# Patient Record
Sex: Male | Born: 1960
Health system: Southern US, Community
[De-identification: ages and names within clinical notes are randomized; demographics above are authoritative.]

## PROBLEM LIST (undated history)

## (undated) DIAGNOSIS — Z6372 Alcoholism and drug addiction in family: Secondary | ICD-10-CM

## (undated) DIAGNOSIS — J309 Allergic rhinitis, unspecified: Secondary | ICD-10-CM

## (undated) DIAGNOSIS — N400 Enlarged prostate without lower urinary tract symptoms: Secondary | ICD-10-CM

## (undated) HISTORY — DX: Alcoholism and drug addiction in family: Z63.72

## (undated) HISTORY — DX: Benign prostatic hyperplasia without lower urinary tract symptoms: N40.0

## (undated) HISTORY — DX: Allergic rhinitis, unspecified: J30.9

---
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
## 2001-05-03 HISTORY — PX: APPENDECTOMY: SHX54

## 2004-05-03 HISTORY — PX: INGUINAL HERNIA REPAIR: SUR1180

## 2004-09-11 ENCOUNTER — Encounter: Payer: Self-pay | Admitting: Family Medicine

## 2007-01-25 ENCOUNTER — Ambulatory Visit: Payer: Self-pay | Admitting: Family Medicine

## 2007-01-25 DIAGNOSIS — N400 Enlarged prostate without lower urinary tract symptoms: Secondary | ICD-10-CM | POA: Insufficient documentation

## 2007-01-27 DIAGNOSIS — J309 Allergic rhinitis, unspecified: Secondary | ICD-10-CM

## 2007-01-30 ENCOUNTER — Encounter: Payer: Self-pay | Admitting: Family Medicine

## 2007-02-10 ENCOUNTER — Encounter (INDEPENDENT_AMBULATORY_CARE_PROVIDER_SITE_OTHER): Payer: Self-pay | Admitting: *Deleted

## 2007-08-28 ENCOUNTER — Telehealth (INDEPENDENT_AMBULATORY_CARE_PROVIDER_SITE_OTHER): Payer: Self-pay | Admitting: Internal Medicine

## 2008-04-17 ENCOUNTER — Telehealth: Payer: Self-pay | Admitting: Family Medicine

## 2008-04-24 ENCOUNTER — Ambulatory Visit: Payer: Self-pay | Admitting: Family Medicine

## 2008-04-24 DIAGNOSIS — T148XXA Other injury of unspecified body region, initial encounter: Secondary | ICD-10-CM

## 2008-06-20 ENCOUNTER — Ambulatory Visit: Payer: Self-pay | Admitting: Family Medicine

## 2009-04-28 ENCOUNTER — Telehealth: Payer: Self-pay | Admitting: Family Medicine

## 2009-05-16 ENCOUNTER — Ambulatory Visit: Payer: Self-pay | Admitting: Family Medicine

## 2009-05-30 ENCOUNTER — Ambulatory Visit: Payer: Self-pay | Admitting: Family Medicine

## 2009-06-02 LAB — CONVERTED CEMR LAB
ALT: 33 units/L (ref 0–53)
Albumin: 4.5 g/dL (ref 3.5–5.2)
BUN: 17 mg/dL (ref 6–23)
Bilirubin, Direct: 0.1 mg/dL (ref 0.0–0.3)
CO2: 31 meq/L (ref 19–32)
Calcium: 9.2 mg/dL (ref 8.4–10.5)
Creatinine, Ser: 1 mg/dL (ref 0.4–1.5)
Glucose, Bld: 100 mg/dL — ABNORMAL HIGH (ref 70–99)
PSA: 2.63 ng/mL (ref 0.10–4.00)
Total Protein: 7.6 g/dL (ref 6.0–8.3)

## 2009-06-04 ENCOUNTER — Ambulatory Visit: Payer: Self-pay | Admitting: Family Medicine

## 2009-06-05 ENCOUNTER — Encounter (INDEPENDENT_AMBULATORY_CARE_PROVIDER_SITE_OTHER): Payer: Self-pay | Admitting: *Deleted

## 2009-06-26 ENCOUNTER — Encounter (INDEPENDENT_AMBULATORY_CARE_PROVIDER_SITE_OTHER): Payer: Self-pay | Admitting: *Deleted

## 2009-06-27 ENCOUNTER — Ambulatory Visit: Payer: Self-pay | Admitting: Gastroenterology

## 2009-07-11 ENCOUNTER — Ambulatory Visit: Payer: Self-pay | Admitting: Gastroenterology

## 2009-07-15 LAB — CONVERTED CEMR LAB
Cholesterol: 190 mg/dL (ref 0–200)
Triglycerides: 51 mg/dL (ref 0.0–149.0)

## 2009-07-16 ENCOUNTER — Encounter: Payer: Self-pay | Admitting: Gastroenterology

## 2010-06-03 NOTE — Procedures (Signed)
Summary: Colonoscopy  Patient: Chase Price Note: All result statuses are Final unless otherwise noted.  Tests: (1) Colonoscopy (COL)   COL Colonoscopy           DONE     [facility]     [address]     [place], [place]  [address]           COLONOSCOPY PROCEDURE REPORT           PATIENT:  Chase Price, Chase Price  MR#:  [id number]     BIRTHDATE:  1960/06/25, 49 yrs. old  GENDER:  male           ENDOSCOPIST:  Barbette Hair. Arlyce Dice, MD     Referred by:  Excell Seltzer, M.D.           PROCEDURE DATE:  07/11/2009     PROCEDURE:  Colonoscopy with snare polypectomy     ASA CLASS:  Class I     INDICATIONS:  Routine Risk Screening           MEDICATIONS:   Fentanyl 75 mcg IV, Versed 8 mg IV           DESCRIPTION OF PROCEDURE:   After the risks benefits and     alternatives of the procedure were thoroughly explained, informed     consent was obtained.  Digital rectal exam was performed and     revealed no abnormalities.   The LB [id number] endoscope     was introduced through the anus and advanced to the cecum, which     was identified by the ileocecal valve, without limitations.  The     quality of the prep was excellent, using MoviPrep.  The instrument     was then slowly withdrawn as the colon was fully examined.     <<PROCEDUREIMAGES>>           FINDINGS:  Four polyps were found in the sigmoid colon (see     image15, image17, and image18). 4 sessile polyps ranging from     3-79mm at 28cm, 15cm, and 5cm each from the anus - removed with     cold polypectomy snare and submitted to pathology  This was     otherwise a normal examination of the colon (see image1, image3,     image4, image5, image8, image10, image11, image13, and image16).     Retroflexed views in the rectum revealed no abnormalities.    The     scope was then withdrawn from the patient and the procedure     completed.           COMPLICATIONS:  None           ENDOSCOPIC IMPRESSION:     1) Four polyps in the  sigmoid colon     2) Otherwise normal examination     RECOMMENDATIONS:     1) colonoscopy           REPEAT EXAM:  In 3 year(s) for Colonoscopy.           ______________________________     Barbette Hair. Arlyce Dice, MD           CC:           n.     eSIGNED:   Barbette Hair. Kaplan at 07/11/2009 08:54 AM           Rozetta Nunnery, [id number]  Note: An exclamation mark (!) indicates a result that was not dispersed into the flowsheet.  Document Creation Date: 07/11/2009 8:54 AM _______________________________________________________________________  (1) Order result status: Final Collection or observation date-time: 07/11/2009 08:44 Requested date-time:  Receipt date-time:  Reported date-time:  Referring Physician:   Ordering Physician: Melvia Heaps [phone]) Specimen Source:  Source: Launa Grill Order Number: [id number] Lab site:   Appended Document: Colonoscopy     Procedures Next Due Date:    Colonoscopy: 07/2014

## 2010-06-03 NOTE — Letter (Signed)
Summary: Previsit letter  [facility]  [address] [place], [place] [address]   Phone: [phone]  Fax: [phone]       06/05/2009 MRN: [id number]  [facility] [address] [place], [place]  [address]  Dear Chase Price,  Welcome to the Gastroenterology Division at [facility].    You are scheduled to see a nurse for your pre-procedure visit on 06-27-09 at 8:00a.m. on the 3rd floor at [facility], [address]  We ask that you try to arrive at our office 15 minutes prior to your appointment time to allow for check-in.  Your nurse visit will consist of discussing your medical and surgical history, your immediate family medical history, and your medications.    Please bring a complete list of all your medications or, if you prefer, bring the medication bottles and we will list them.  We will need to be aware of both prescribed and over the counter drugs.  We will need to know exact dosage information as well.  If you are on blood thinners (Coumadin, Plavix, Aggrenox, Ticlid, etc.) please call our office today/prior to your appointment, as we need to consult with your physician about holding your medication.   Please be prepared to read and sign documents such as consent forms, a financial agreement, and acknowledgement forms.  If necessary, and with your consent, a friend or relative is welcome to sit-in on the nurse visit with you.  Please bring your insurance card so that we may make a copy of it.  If your insurance requires a referral to see a specialist, please bring your referral form from your primary care physician.  No co-pay is required for this nurse visit.     If you cannot keep your appointment, please call [phone] to cancel or reschedule prior to your appointment date.  This allows [place] the opportunity to schedule an appointment for another patient in need of care.    Thank you for choosing [facility] for your medical  needs.  We appreciate the opportunity to care for you.  Please visit [place] at our website  to learn more about our practice.                     Sincerely.                                                                                                                   The Gastroenterology Division

## 2010-06-03 NOTE — Assessment & Plan Note (Signed)
Summary: CPX.Marland Kitchen CYD   Vital Signs:  Patient profile:   50 year old male Height:      70 inches Weight:      188.25 pounds BMI:     27.11 Temp:     97.9 degrees F oral Pulse rate:   84 / minute Pulse rhythm:   regular BP sitting:   110 / 70  (left arm) Cuff size:   large  Vitals Entered By: Delilah Shan CMA Duncan Dull) (June 04, 2009 9:13 AM) CC: CPX   History of Present Illness: The patient is here for annual wellness exam and preventative care.     Doing well overall, no complaints.  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 Take 1 Tablet By Mouth Once A Day 2)  Zyrtec Allergy 10 Mg  Tabs (Cetirizine Hcl) .... Take 1 Tablet By Mouth Once A Day  Allergies (verified): No Known Drug Allergies  Review of Systems General:  Denies fatigue and fever. ENT:  allergies well controlled..only issues in spring. Plans to use singulair only in spring.. CV:  Denies chest pain or discomfort. Resp:  Denies shortness of breath. GI:  Denies abdominal pain, constipation, and diarrhea. GU:  Denies dysuria, hematuria, incontinence, urinary frequency, and urinary hesitancy. Derm:  Denies lesion(s). Psych:  Denies anxiety and depression. Endo:  Denies weight change.  Physical Exam  General:  Well-developed,well-nourished,in no acute distress; alert,appropriate and cooperative throughout examination Ears:  External ear exam shows no significant lesions or deformities.  Otoscopic examination reveals clear canals, tympanic membranes are intact bilaterally without bulging, retraction, inflammation or discharge. Hearing is grossly  normal bilaterally. Nose:  External nasal examination shows no deformity or inflammation. Nasal mucosa are pink and moist without lesions or exudates. Mouth:  Oral mucosa and oropharynx without lesions or exudates.  Teeth in good repair. Neck:  no carotid bruit or thyromegaly no cervical or supraclavicular lymphadenopathy  Lungs:  Normal respiratory effort, chest expands symmetrically. Lungs are clear to auscultation, no crackles or wheezes. Heart:  Normal rate and regular rhythm. S1 and S2 normal without gallop, murmur, click, rub or other extra sounds. Abdomen:  Bowel sounds positive,abdomen soft and non-tender without masses, organomegaly or hernias noted. Rectal:  No external abnormalities noted. Normal sphincter tone. No rectal masses or tenderness. Genitalia:  Testes bilaterally descended without nodularity, tenderness or masses. No scrotal masses or lesions. No penis lesions or urethral discharge. Does have two hernia repair scars, intact. Prostate:  Prostate gland firm and smooth, no enlargement, nodularity, tenderness, mass, asymmetry or induration. Msk:  No deformity or scoliosis noted of thoracic or lumbar spine.   Pulses:  R and L posterior tibial pulses are full and equal bilaterally  Extremities:  no edema  Neurologic:  No cranial nerve deficits noted. Station and gait are normal. Plantar reflexes are down-going bilaterally. DTRs are symmetrical throughout. Sensory, motor and coordinative functions appear intact. Skin:  Intact without suspicious lesions or rashes Psych:  Cognition and judgment appear intact. Alert and cooperative with normal attention span and concentration. No apparent delusions, illusions, hallucinations   Impression & Recommendations:  Problem # 1:  Preventive Health Care (ICD-V70.0) Reviewed preventive  care protocols, scheduled due services, and updated immunizations. Encouraged exercise, weight loss, healthy eating habits.  Reviewed labs in detail with  pt..cholesterol not ordered correctly. Will add on.   Complete Medication List: 1)  Singulair 10 Mg Tabs (Montelukast sodium) .... Take 1 tablet by mouth once a day 2)  Zyrtec Allergy 10 Mg Tabs (Cetirizine hcl) .... Take 1 tablet by mouth once a day  Patient Instructions: 1)  Stool Cards please return.  2)  It is important that you exercise reguarly at least 20 minutes 5 times a week. If you develop chest pain, have severe difficulty breathing, or feel very tired, stop exercising immediately and seek medical attention.  3)  Please schedule a follow-up appointment in 1 year.   Current Allergies (reviewed today): No known allergies   Last TD:  Td (01/01/1998 2:31:00 PM) TD Next Due:  Refused Colonoscopy Next Due:  Not Indicated  Appended Document: Orders Update    Clinical Lists Changes  Problems: Added new problem of SCREENING, COLON CANCER (ICD-V76.51) Orders: Added new Referral order of Gastroenterology Referral (GI) - Signed

## 2010-06-03 NOTE — Letter (Signed)
Summary: [facility] Instructions  [facility]  [address] [place], [place] [address]   Phone: [phone]  Fax: [phone]       Chase Price    02-Jul-1960    MRN: [id number]        Procedure Day [name]:  [name]  07/11/09     Arrival Time:  7:30AM     Procedure Time:  8:00AM     Location of Procedure:                    [facility] (4th Floor)                      PREPARATION FOR COLONOSCOPY WITH MOVIPREP   Starting 5 days prior to your procedure 07/06/09 do not eat nuts, seeds, popcorn, corn, beans, peas,  salads, or any raw vegetables.  Do not take any fiber supplements (e.g. Metamucil, Citrucel, and Benefiber).  THE DAY BEFORE YOUR PROCEDURE         DATE: 07/10/09  DAY: THURSDAY  1.  Drink clear liquids the entire day-NO SOLID FOOD  2.  Do not drink anything colored red or purple.  Avoid juices with pulp.  No orange juice.  3.  Drink at least 64 oz. (8 glasses) of fluid/clear liquids during the day to prevent dehydration and help the prep work efficiently.  CLEAR LIQUIDS INCLUDE: Water Jello Ice Popsicles Tea (sugar ok, no milk/cream) Powdered fruit flavored drinks Coffee (sugar ok, no milk/cream) Gatorade Juice: apple, white grape, white cranberry  Lemonade Clear bullion, consomm, broth Carbonated beverages (any kind) Strained chicken noodle soup Hard Candy                             4.  In the morning, mix first dose of MoviPrep solution:    Empty 1 Pouch A and 1 Pouch B into the disposable container    Add lukewarm drinking water to the top line of the container. Mix to dissolve    Refrigerate (mixed solution should be used within 24 hrs)  5.  Begin drinking the prep at 5:00 p.m. The MoviPrep container is divided by 4 marks.   Every 15 minutes drink the solution down to the next mark (approximately 8 oz) until the full liter is complete.   6.  Follow completed prep with 16 oz of clear liquid of your choice (Nothing red  or purple).  Continue to drink clear liquids until bedtime.  7.  Before going to bed, mix second dose of MoviPrep solution:    Empty 1 Pouch A and 1 Pouch B into the disposable container    Add lukewarm drinking water to the top line of the container. Mix to dissolve    Refrigerate  THE DAY OF YOUR PROCEDURE      DATE: 07/11/09  DAY: FRIDAY  Beginning at 3:00AM (5 hours before procedure):         1. Every 15 minutes, drink the solution down to the next mark (approx 8 oz) until the full liter is complete.  2. Follow completed prep with 16 oz. of clear liquid of your choice.    3. You may drink clear liquids until 6:00AM (2 HOURS BEFORE PROCEDURE).   MEDICATION INSTRUCTIONS  Unless otherwise instructed, you should take regular prescription medications with a small sip of water   as early as possible the morning of your  procedure.         OTHER INSTRUCTIONS  You will need a responsible adult at least 52 years of age to accompany you and drive you home.   This person must remain in the waiting room during your procedure.  Wear loose fitting clothing that is easily removed.  Leave jewelry and other valuables at home.  However, you may wish to bring a book to read or  an iPod/MP3 player to listen to music as you wait for your procedure to start.  Remove all body piercing jewelry and leave at home.  Total time from sign-in until discharge is approximately 2-3 hours.  You should go home directly after your procedure and rest.  You can resume normal activities the  day after your procedure.  The day of your procedure you should not:   Drive   Make legal decisions   Operate machinery   Drink alcohol   Return to work  You will receive specific instructions about eating, activities and medications before you leave.    The above instructions have been reviewed and explained to me by   Wyona Almas RN  June 27, 2009 8:36 AM    I fully understand and can  verbalize these instructions _____________________________ Date _________

## 2010-06-03 NOTE — Letter (Signed)
Summary: Patient Notice-Hyperplastic Polyps  [facility]  [address] [place], [place] [address]   Phone: [phone]  Fax: [phone]        July 16, 2009 MRN: [id number]    [facility] [address] [place], [place]  [address]    Dear Mr. Evon,  I am pleased to inform you that the colon polyp(s) removed during your recent colonoscopy was (were) found to be hyperplastic.  These types of polyps are NOT pre-cancerous.  It is therefore my recommendation that you have a repeat colonoscopy examination in 5_ years for routine colorectal cancer screening.  Should you develop new or worsening symptoms of abdominal pain, bowel habit changes or bleeding from the rectum or bowels, please schedule an evaluation with either your primary care physician or with me.  Additional information/recommendations:  __No further action with gastroenterology is needed at this time.      Please follow-up with your primary care physician for your other      healthcare needs. __Please call [phone] to schedule a return visit to review      your situation.  __Please keep your follow-up visit as already scheduled.  _x_Continue treatment plan as outlined the day of your exam.  Please call us if you are having persistent problems or have questions about your condition that have not been fully answered at this time.  Sincerely,  Louis Meckel MD This letter has been electronically signed by your physician.  Appended Document: Patient Notice-Hyperplastic Polyps Letter mailed 3.17.11

## 2010-06-03 NOTE — Assessment & Plan Note (Signed)
Summary: MED REFILLS PER DR. SCHALLER/RI   Vital Signs:  Patient profile:   50 year old male Height:      70 inches Weight:      188.4 pounds BMI:     27.13 Temp:     98.4 degrees F oral Pulse rate:   76 / minute Pulse rhythm:   regular BP sitting:   110 / 78  (left arm) Cuff size:   regular  Vitals Entered By: Benny Lennert CMA Duncan Dull) (May 16, 2009 11:04 AM)  History of Present Illness: Chief complaint med refill per dr schaller  Allergies well controlled on singulair and zyrtec daily. No asthma issues.   Intermittant ttp in left forearm  Most often occurs in AM. 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  No weakness, no numbness.   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 Take 1 Tablet By Mouth Once A Day 2)  Zyrtec Allergy 10 Mg  Tabs (Cetirizine Hcl) .... Take 1 Tablet By Mouth Once A Day  Allergies (verified): No Known Drug Allergies  Past History:  Past medical, surgical, family and social histories (including risk factors) reviewed, and no changes noted (except as noted below).  Past Medical History: Reviewed history from 01/25/2007 and no changes required. Allergic rhinitis  Past Surgical History: Reviewed history from 01/25/2007 and no changes required. 1969 inguinal hernia 2006 hernia, inguinal Appendectomy 2003 treadmill stress test neg 2002  Family History: Reviewed history from 01/25/2007 and no changes required. father age 64 MI, CAD mother age 77 healthy 3 brothers, 2 sister healthy PGM: ? cancer PGF: ? cancer MGM; colon cancer MGF: depression, severe Family History of Alcoholism/Addiction  Social History: Reviewed history from 01/25/2007 and no changes  required. Occupation:linen/uniforms Married x 15  2 child healthy Former Smoker 2o pack year history, Quit 2003 Alcohol use-yes, wine glass once x month Drug use-no Regular exercise-yes, walks a lot at work Diet:  Regular exercise-healthy, no soda, lots of water  Review of Systems General:  Denies fatigue and fever. CV:  Denies chest pain or discomfort. Resp:  Denies shortness of breath. GI:  Denies abdominal pain, bloody stools, constipation, and diarrhea. GU:  Complains of nocturia; denies dysuria, incontinence, and urinary hesitancy.  Physical Exam  General:  Well-developed,well-nourished,in no acute distress; alert,appropriate and cooperative throughout examination Eyes:  No corneal or conjunctival inflammation noted. EOMI. Perrla. Funduscopic exam benign, without hemorrhages, exudates or papilledema. Vision grossly normal. Ears:  External ear exam shows no significant lesions or deformities.  Otoscopic examination reveals clear canals, tympanic membranes are intact bilaterally without bulging, retraction, inflammation or discharge. Hearing is grossly normal bilaterally. Nose:  External nasal examination shows no deformity or inflammation. Nasal mucosa are pink and moist without lesions or exudates. Mouth:  MMM Neck:  no carotid bruit or thyromegaly no cervical or supraclavicular lymphadenopathy  Lungs:  Normal respiratory effort, chest expands symmetrically. Lungs are clear to auscultation, no crackles or wheezes. Heart:  Normal rate and regular rhythm. S1 and S2 normal without gallop, murmur, click, rub or other extra sounds. Msk:  ttp over left brachioradialis neg finklestein, neg tinel/phalen   Impression & Recommendations:  Problem # 1:  ALLERGIC RHINITIS (ICD-477.9) Well controlled on current meds.  His updated medication list for this  problem includes:    Zyrtec Allergy 10 Mg Tabs (Cetirizine hcl) .Marland Kitchen... Take 1 tablet by mouth once a day  Problem # 2:  MUSCLE STRAIN  (ICD-848.9) NSAIDs, gentle stretching of left arm. Follow up if not improving.   Complete Medication List: 1)  Singulair 10 Mg Tabs (Montelukast sodium) .... Take 1 tablet by mouth once a day 2)  Zyrtec Allergy 10 Mg Tabs (Cetirizine hcl) .... Take 1 tablet by mouth once a day  Patient Instructions: 1)  Schedule CPX with fasting lipids, CMET, PSA Dx v77.91,v76.44 2)  Ibuprofen 800 mg ever y6 hours x 2-3 days, then take as needed. 3)  Gentle rehab stretches.  Prescriptions: SINGULAIR 10 MG  TABS (MONTELUKAST SODIUM) Take 1 tablet by mouth once a day  #30 x 11   Entered and Authorized by:   Kerby Nora MD   Signed by:   Kerby Nora MD on 05/16/2009   Method used:   [facility] to        [facility]  [address] [facility] (retail[facility]       [address]       [place], [place]  [address]       Ph: [phone] or [phone]       Fax: [phone]   RxID:   [id number]   Current Allergies (reviewed today): No known allergies   Flu Vaccine Result Date:  04/02/2009 Flu Vaccine Result:  given Flu Vaccine Next Due:  1 yr Flex Sig Next Due:  Not Indicated

## 2010-06-03 NOTE — Miscellaneous (Signed)
Summary: [facility] Previsit/prep  Clinical Lists Changes  Medications: Added new medication of MOVIPREP 100 GM  SOLR (PEG-KCL-NACL-NASULF-NA ASC-C) As per prep instructions. - Signed Rx of MOVIPREP 100 GM  SOLR (PEG-KCL-NACL-NASULF-NA ASC-C) As per prep instructions.;  #1 x 0;  Signed;  Entered by: Wyona Almas RN;  Authorized by: Louis Meckel MD;  Method used: [facility]  [address] [place], [place]  [address], Ph: [phone] or [phone], Fax: [phone] Observations: Added new observation of NKA: T (06/27/2009 8:08)    Prescriptions: MOVIPREP 100 GM  SOLR (PEG-KCL-NACL-NASULF-NA ASC-C) As per prep instructions.  #1 x 0   Entered by:   Wyona Almas RN   Authorized by:   Louis Meckel MD   Signed by:   Wyona Almas RN on 06/27/2009   Method used:   [facility]        [facility] (retail[facility]       [address]       [place], [place]  [address]       Ph: [phone] or [phone]       Fax: [phone]   RxID:   [id number]

## 2010-07-29 ENCOUNTER — Encounter: Payer: Self-pay | Admitting: *Deleted

## 2010-07-29 ENCOUNTER — Encounter: Payer: Self-pay | Admitting: Family Medicine

## 2010-07-29 ENCOUNTER — Ambulatory Visit (INDEPENDENT_AMBULATORY_CARE_PROVIDER_SITE_OTHER): Payer: BC Managed Care – PPO | Admitting: Family Medicine

## 2010-07-29 VITALS — BP 116/78 | HR 80 | Temp 98.5°F | Wt 188.0 lb

## 2010-07-29 DIAGNOSIS — J309 Allergic rhinitis, unspecified: Secondary | ICD-10-CM

## 2010-07-29 DIAGNOSIS — J069 Acute upper respiratory infection, unspecified: Secondary | ICD-10-CM | POA: Insufficient documentation

## 2010-07-29 MED ORDER — FLUTICASONE PROPIONATE 50 MCG/ACT NA SUSP: 2.0000 | Freq: Every day | NASAL | Status: DC

## 2010-07-29 NOTE — Assessment & Plan Note (Signed)
Supportive care.   Red flags to return discussed.

## 2010-07-29 NOTE — Patient Instructions (Addendum)
Sounds like you have a viral upper respiratory infection on top of allergy flare. Antibiotics are not needed for this.  Viral infections usually take 7-10 days to resolve.  The cough can last several weeks to go away. Use medication as prescribed: flonase 2 sprays daily for allergies. Change antihistamine to claritin or allegra. Continue nasal saline spray/irrigation daily. Push fluids and plenty of rest. Guaifenesin or simple mucinex with plenty of water to mobilize mucous out. Please return if you are not improving as expected, or if you have high fevers (>101.5) or difficulty swallowing or worsening productive cough. Call clinic with questions.  Pleasure to see you today.

## 2010-07-29 NOTE — Progress Notes (Signed)
  Subjective:    Patient ID: Chase Price, male    DOB: May 26, 1960, 50 y.o.   MRN: [id number]  HPI CC: chest cold  1 wk h/o sinus congestion and chest cold.  Mild cough, productive of mild yellow mucous.  Constantly blowing nose.  + watery eyes.  Mild hoaresness.  No fevers/chils, n/v/d, abd pain.  No sneezing or itchy eyes.  No new rashes.  Mild ST as well.  Taking singulair and zyrtec over last few years.  Has tried saline spray as well as OTC cold pills.  No sick contacts, no smokers at home.  No h/o asthma.  Recently travelled.  Medications and allergies reviewed and updated as above. PMHx reviewed and updated.  Review of Systems Per HPI    Objective:   Physical Exam  [vitalsreviewed. Constitutional: He appears well-developed and well-nourished. No distress.  HENT:  Head: Normocephalic and atraumatic.  Right Ear: Tympanic membrane, external ear and ear canal normal.  Left Ear: Tympanic membrane, external ear and ear canal normal.  Nose: Nose normal. No mucosal edema or rhinorrhea. Right sinus exhibits no maxillary sinus tenderness and no frontal sinus tenderness. Left sinus exhibits no maxillary sinus tenderness and no frontal sinus tenderness.  Mouth/Throat: Oropharynx is clear and moist. No oropharyngeal exudate.       Dry blood L nare.    Eyes: Conjunctivae and EOM are normal. Pupils are equal, round, and reactive to light. No scleral icterus.  Neck: Normal range of motion. Neck supple. No thyromegaly present.  Cardiovascular: Normal rate, regular rhythm, normal heart sounds and intact distal pulses.   No murmur heard. Pulmonary/Chest: Effort normal and breath sounds normal. No respiratory distress. He has no wheezes. He has no rales.  Lymphadenopathy:    He has no cervical adenopathy.  Skin: Skin is warm and dry. No rash noted.          Assessment & Plan:

## 2010-07-29 NOTE — Assessment & Plan Note (Signed)
Deteriorated in setting of current viral URTI. Has been on singulair and zyrtec for years.  rec change antihistamine to claritin or allegra. Add on INS (flonase).  Return if not improved as expected.

## 2010-10-31 ENCOUNTER — Telehealth: Payer: Self-pay | Admitting: *Deleted

## 2010-10-31 ENCOUNTER — Ambulatory Visit (INDEPENDENT_AMBULATORY_CARE_PROVIDER_SITE_OTHER): Payer: BC Managed Care – PPO | Admitting: Family Medicine

## 2010-10-31 DIAGNOSIS — H109 Unspecified conjunctivitis: Secondary | ICD-10-CM | POA: Insufficient documentation

## 2010-10-31 MED ORDER — POLYMYXIN B-TRIMETHOPRIM 10000-0.1 UNIT/ML-% OP SOLN: OPHTHALMIC | Status: DC

## 2010-10-31 NOTE — Progress Notes (Signed)
  Subjective:    Patient ID: Chase Price, male    DOB: 10-29-1960, 50 y.o.   MRN: [id number]  HPI Eye irritation- sxs started Tuesday.  Has been cleaning eye lids w/ baby shampoo, reports hx of blepharitis.  Having 'white mucous discharge' from eyes bilaterally.  Denies pain.   Review of Systems For ROS see HPI     Objective:   Physical Exam  Constitutional: He appears well-developed and well-nourished. No distress.  Eyes: EOM are normal. Pupils are equal, round, and reactive to light. Right eye exhibits discharge. Left eye exhibits discharge.       Pt w/ bilateral injected conjunctiva and white d/c in medial corners          Assessment & Plan:

## 2010-10-31 NOTE — Assessment & Plan Note (Signed)
This is not blepharitis despite pt's report.  Pt w/ obvious bilateral conjunctivitis.  Start abx.  Reviewed supportive care and red flags that should prompt return.  Pt expressed understanding and is in agreement w/ plan.

## 2010-10-31 NOTE — Patient Instructions (Signed)
This appears to be a conjunctivitis (pink eye) Use the drops as directed If you have worsening redness, pain, or swelling- please call your office on Monday Call with any questions or concerns Hang in there!

## 2010-10-31 NOTE — Telephone Encounter (Signed)
Triage Record Num: [id number] Operator: Geanie Berlin Patient Name: Chase Price Call Date & Time: 10/31/2010 10:07:34AM Patient Phone: [phone] PCP: Kerby Nora Patient Gender: Male PCP Fax : [phone] Patient DOB: 1960/05/04 Practice Name: [facility] Reason for Call: 50 yo Called re bilateral blood shot eyes with white pasty drainage. Onset: 10/27/10. Afebrile. Eyes are tearing, minimal swelling, R eyelid is red. Using qtip with baby-shamppo to cleanse eyes. Advised to see MD within 4 hrs for new onset of severe sensitivity to light, tearing or discharge per Eye Infection or Irritation Guideline. Transferred to [facility]. Protocol(s) Used: Eye: Infection or Irritation Recommended Outcome per Protocol: See Provider within 4 hours Reason for Outcome: New onset of severe sensitivity to light or glare (photophobia), eye pain, reduced vision, tearing, or discharge. Care Advice: ~ Another adult should drive if vision is impaired. Limit or avoid exposure to irritants and allergens (e.g. air pollution, smoke/smoking, chemicals, dust, pollen, pet dander, etc.) ~ ~ Call provider if symptoms worsen or new symptoms develop. ~ Wear dark UV-blocking sunglasses to protect eyes from sun exposure or for light sensitivity. ~ SYMPTOM / CONDITION MANAGEMENT 10/31/2010 10:19:04AM Page 1 of 1 [id number]

## 2011-08-08 ENCOUNTER — Other Ambulatory Visit: Payer: Self-pay | Admitting: Family Medicine

## 2014-07-12 ENCOUNTER — Encounter: Payer: Self-pay | Admitting: Gastroenterology

## 2015-02-26 ENCOUNTER — Encounter: Payer: Self-pay | Admitting: Gastroenterology

## 2015-04-23 ENCOUNTER — Encounter: Payer: Self-pay | Admitting: Internal Medicine

## 2015-04-23 ENCOUNTER — Ambulatory Visit (INDEPENDENT_AMBULATORY_CARE_PROVIDER_SITE_OTHER): Payer: BLUE CROSS/BLUE SHIELD | Admitting: Internal Medicine

## 2015-04-23 VITALS — BP 120/80 | HR 73 | Temp 98.2°F | Wt 186.0 lb

## 2015-04-23 DIAGNOSIS — J029 Acute pharyngitis, unspecified: Secondary | ICD-10-CM

## 2015-04-23 LAB — POCT RAPID STREP A (OFFICE): Rapid Strep A Screen: NEGATIVE

## 2015-04-23 MED ORDER — PENICILLIN V POTASSIUM 500 MG PO TABS: 500.0000 mg | ORAL_TABLET | Freq: Two times a day (BID) | ORAL | Status: DC

## 2015-04-23 NOTE — Patient Instructions (Signed)
Please only start the antibiotic if your throat gets really worse, you get fever, etc

## 2015-04-23 NOTE — Progress Notes (Signed)
Pre visit review using our clinic review tool, if applicable. No additional management support is needed unless otherwise documented below in the visit note. 

## 2015-04-23 NOTE — Assessment & Plan Note (Addendum)
Presentation not consistent with strep other than exposure to his 3212 year daughter who was diagnosed this week Rapid strep is negative Will give Rx for PCN if conspicuously worsens later this week---since he has had household contact

## 2015-04-23 NOTE — Progress Notes (Signed)
   Subjective:    Patient ID: Chase Price, male    DOB: 10/18/1960, 54 y.o.   MRN: [id number]  HPI Daughter who is 12 got strep throat earlier this week Seen at urgent care and started Rx He noticed throat itching this morning  No fever No shakes or chills No cough Mild head congestion in past week No ear pain  No meds  No current outpatient prescriptions on file prior to visit.   No current facility-administered medications on file prior to visit.    No Known Allergies  Past Medical History  Diagnosis Date  . Allergic rhinitis, cause unspecified   . Alcoholism in family   . Unspecified hyperplasia of prostate without urinary obstruction and other lower urinary tract symptoms (LUTS)     Past Surgical History  Procedure Laterality Date  . Inguinal hernia repair  1969  . Inguinal hernia repair  2006  . Appendectomy  2003    Family History  Problem Relation Age of Onset  . Heart attack Father   . Coronary artery disease Father   . Colon cancer Maternal Grandmother   . Depression Maternal Grandfather     Severe  . Alcohol abuse      Family history    Social History   Social History  . Marital Status: Married    Spouse Name: N/A  . Number of Children: 2  . Years of Education: N/A   Occupational History  . Linen/Uniforms    Social History Main Topics  . Smoking status: Former Smoker -- 20 years    Quit date: 06/20/2001  . Smokeless tobacco: Not on file  . Alcohol Use: Yes     Comment: 1 glass of wine/month  . Drug Use: No  . Sexual Activity: Not on file   Other Topics Concern  . Not on file   Social History Narrative   Review of Systems No rash No N/V or diarrhea Appetite is okay    Objective:   Physical Exam  Constitutional: He appears well-developed and well-nourished. No distress.  HENT:  No sinus tenderness TMs normal Slight nasal inflammation Pharynx is mildly red--no tonsil enlargement or exudates  Neck: Normal range of motion.  Neck supple.  Pulmonary/Chest: Effort normal and breath sounds normal. No respiratory distress. He has no wheezes. He has no rales.  Lymphadenopathy:    He has no cervical adenopathy.  Skin: No rash noted.          Assessment & Plan:

## 2015-05-16 ENCOUNTER — Ambulatory Visit (INDEPENDENT_AMBULATORY_CARE_PROVIDER_SITE_OTHER): Payer: BLUE CROSS/BLUE SHIELD | Admitting: Family Medicine

## 2015-05-16 ENCOUNTER — Encounter: Payer: Self-pay | Admitting: Family Medicine

## 2015-05-16 ENCOUNTER — Other Ambulatory Visit: Payer: Self-pay | Admitting: Family Medicine

## 2015-05-16 VITALS — BP 100/62 | HR 74 | Temp 98.4°F | Ht 68.5 in | Wt 183.5 lb

## 2015-05-16 DIAGNOSIS — Z125 Encounter for screening for malignant neoplasm of prostate: Secondary | ICD-10-CM | POA: Diagnosis not present

## 2015-05-16 DIAGNOSIS — Z0000 Encounter for general adult medical examination without abnormal findings: Secondary | ICD-10-CM | POA: Diagnosis not present

## 2015-05-16 DIAGNOSIS — Z13220 Encounter for screening for lipoid disorders: Secondary | ICD-10-CM | POA: Diagnosis not present

## 2015-05-16 DIAGNOSIS — N400 Enlarged prostate without lower urinary tract symptoms: Secondary | ICD-10-CM

## 2015-05-16 DIAGNOSIS — L723 Sebaceous cyst: Secondary | ICD-10-CM | POA: Insufficient documentation

## 2015-05-16 DIAGNOSIS — J309 Allergic rhinitis, unspecified: Secondary | ICD-10-CM

## 2015-05-16 DIAGNOSIS — Z1159 Encounter for screening for other viral diseases: Secondary | ICD-10-CM

## 2015-05-16 LAB — COMPREHENSIVE METABOLIC PANEL
ALK PHOS: 35 U/L — AB (ref 39–117)
ALT: 23 U/L (ref 0–53)
AST: 17 U/L (ref 0–37)
Albumin: 4.7 g/dL (ref 3.5–5.2)
BILIRUBIN TOTAL: 0.7 mg/dL (ref 0.2–1.2)
BUN: 11 mg/dL (ref 6–23)
CO2: 31 meq/L (ref 19–32)
Calcium: 10 mg/dL (ref 8.4–10.5)
Chloride: 103 mEq/L (ref 96–112)
Creatinine, Ser: 0.95 mg/dL (ref 0.40–1.50)
GFR: 86.89 mL/min (ref >60.00)
GLUCOSE: 82 mg/dL (ref 70–99)
Potassium: 4.1 mEq/L (ref 3.5–5.1)
SODIUM: 142 meq/L (ref 135–145)
TOTAL PROTEIN: 7.3 g/dL (ref 6.0–8.3)

## 2015-05-16 LAB — PSA: PSA: 3.69 ng/mL (ref 0.10–4.00)

## 2015-05-16 NOTE — Progress Notes (Signed)
Pre visit review using our clinic review tool, if applicable. No additional management support is needed unless otherwise documented below in the visit note. 

## 2015-05-16 NOTE — Addendum Note (Signed)
Addended by: Alvina ChouWALSH, Beza Steppe J on: 05/16/2015 09:48 AM   Modules accepted: Orders

## 2015-05-16 NOTE — Assessment & Plan Note (Signed)
On scalp, no infection sign. Pt reassured.

## 2015-05-16 NOTE — Progress Notes (Signed)
Subjective:    Patient ID: Chase Price, male    DOB: 09/09/1960, 55 y.o.   MRN: [id number]  HPI  55 year old male presents to re-establish care.  Last CPX was 6 years ago. Last labs was 6 years ago as well.  Doing well overall.  Acute pharyngitis improving.  Allergic rhinitis well controlled with flonase.  Wt Readings from Last 3 Encounters:  05/16/15 183 lb 8 oz (83.235 kg)  04/23/15 186 lb (84.369 kg)  10/31/10 187 lb 8 oz (85.049 kg)   Diet: Healthy, water, does drink a lot of coffee 4 cups in AM.  Exercise:walking a lot at work   He noted lump on back on head presents for many years, getting slightly bigger. Was pea size now quarter size.  No tenderness, redness, no discharge. Father side of family has simiilar prominence.  Slightly sore neck bilaterally over muscles.  No radiculopathy.  His wife has noted that his spine seems to becoming crooked. Uses right arm a lot more at work than left.  Social History /Family History/Past Medical History reviewed and updated if needed.   Review of Systems  Constitutional: Negative for fever and fatigue.  Respiratory: Negative for shortness of breath.   Cardiovascular: Negative for chest pain.  Gastrointestinal: Negative for abdominal pain.  Genitourinary: Positive for urgency and frequency. Negative for dysuria, hematuria, decreased urine volume, difficulty urinating and genital sores.  Neurological: Negative for syncope and headaches.  Psychiatric/Behavioral: Negative for behavioral problems, dysphoric mood and agitation. The patient is not nervous/anxious.        Objective:   Physical Exam  Constitutional: He appears well-developed and well-nourished.  Non-toxic appearance. He does not appear ill. No distress.  HENT:  Head: Normocephalic and atraumatic.  Right Ear: Hearing, tympanic membrane, external ear and ear canal normal.  Left Ear: Hearing, tympanic membrane, external ear and ear canal normal.  Nose: Nose  normal.  Mouth/Throat: Uvula is midline, oropharynx is clear and moist and mucous membranes are normal.  Eyes: Conjunctivae, EOM and lids are normal. Pupils are equal, round, and reactive to light. Lids are everted and swept, no foreign bodies found.  Neck: Trachea normal, normal range of motion and phonation normal. Neck supple. Carotid bruit is not present. No thyroid mass and no thyromegaly present.  Cardiovascular: Normal rate, regular rhythm, S1 normal, S2 normal, intact distal pulses and normal pulses.  Exam reveals no gallop.   No murmur heard. Pulmonary/Chest: Breath sounds normal. He has no wheezes. He has no rhonchi. He has no rales.  Abdominal: Soft. Normal appearance and bowel sounds are normal. There is no hepatosplenomegaly. There is no tenderness. There is no rebound, no guarding and no CVA tenderness. No hernia. Hernia confirmed negative in the right inguinal area and confirmed negative in the left inguinal area.  Genitourinary: Testes normal and penis normal. Rectal exam shows no external hemorrhoid, no internal hemorrhoid, no fissure, no mass, no tenderness and anal tone normal. Guaiac negative stool. Prostate is enlarged. Prostate is not tender. Right testis shows no mass and no tenderness. Left testis shows no mass and no tenderness. No paraphimosis or penile tenderness.  No nodules in prostate  Lymphadenopathy:    He has no cervical adenopathy.       Right: No inguinal adenopathy present.       Left: No inguinal adenopathy present.  Neurological: He is alert. He has normal strength and normal reflexes. No cranial nerve deficit or sensory deficit. Gait normal.  Skin: Skin  is warm, dry and intact. No rash noted.  Sebaceous cyst on left posterior scalp, mobile, non tender, no redness  Psychiatric: He has a normal mood and affect. His speech is normal and behavior is normal. Judgment normal.          Assessment & Plan:  The patient's preventative maintenance and recommended  screening tests for an annual wellness exam were reviewed in full today. Brought up to date unless services declined.  Counselled on the importance of diet, exercise, and its role in overall health and mortality. The patient's FH and SH was reviewed, including their home life, tobacco status, and drug and alcohol status.    Vaccines: due for Tdap uptodate with flu  Due for PS and prostate screen. Will do rectal exam given urinary issues. Colonoscopy: 2004 polyps  Dr. Arlyce Dice, rec repeat in 3 years. Due for hep C  Refuses HIV screen.  Former smoker 20 pack year history quit 14 years ago.

## 2015-05-16 NOTE — Patient Instructions (Addendum)
Consider Tdap vaccine.  Return for fasting labs in the next few weeks.  Call Dr. Marzetta BoardKaplan's office to schedule colonoscopy repeat.

## 2015-05-16 NOTE — Assessment & Plan Note (Signed)
Flonase 2 sprays keeps well controlled.

## 2015-05-17 LAB — HEPATITIS C ANTIBODY: HCV Ab: NEGATIVE

## 2015-05-20 ENCOUNTER — Encounter: Payer: Self-pay | Admitting: *Deleted

## 2015-05-30 ENCOUNTER — Encounter: Payer: Self-pay | Admitting: *Deleted

## 2015-05-30 ENCOUNTER — Other Ambulatory Visit (INDEPENDENT_AMBULATORY_CARE_PROVIDER_SITE_OTHER): Payer: BLUE CROSS/BLUE SHIELD

## 2015-05-30 DIAGNOSIS — Z13220 Encounter for screening for lipoid disorders: Secondary | ICD-10-CM

## 2015-05-30 LAB — LIPID PANEL
CHOLESTEROL: 157 mg/dL (ref 0–200)
HDL: 52.5 mg/dL (ref >39.00)
LDL CALC: 92 mg/dL (ref 0–99)
NonHDL: 104.89
Total CHOL/HDL Ratio: 3
Triglycerides: 65 mg/dL (ref 0.0–149.0)
VLDL: 13 mg/dL (ref 0.0–40.0)

## 2015-12-10 ENCOUNTER — Encounter: Payer: Self-pay | Admitting: Family Medicine

## 2015-12-10 ENCOUNTER — Ambulatory Visit (INDEPENDENT_AMBULATORY_CARE_PROVIDER_SITE_OTHER): Payer: BLUE CROSS/BLUE SHIELD | Admitting: Family Medicine

## 2015-12-10 VITALS — BP 120/82 | HR 77 | Temp 98.2°F | Wt 182.0 lb

## 2015-12-10 DIAGNOSIS — J3089 Other allergic rhinitis: Secondary | ICD-10-CM | POA: Diagnosis not present

## 2015-12-10 MED ORDER — LEVOCETIRIZINE DIHYDROCHLORIDE 5 MG PO TABS: 5.0000 mg | ORAL_TABLET | Freq: Every evening | ORAL | 3 refills | Status: DC

## 2015-12-10 NOTE — Progress Notes (Signed)
Pre visit review using our clinic review tool, if applicable. No additional management support is needed unless otherwise documented below in the visit note. 

## 2015-12-10 NOTE — Progress Notes (Deleted)
SUBJECTIVE:  Chase Price is a 55 y.o. male pt of Dr. Ermalene SearingBedsole, new to me, who complains of {uri sx:315001} for *** days. He denies a history of {hx resp sx additional:315009} and {has/denies:315300} a history of asthma. Patient {has/denies:315300} smoke cigarettes.   No current outpatient prescriptions on file prior to visit.   No current facility-administered medications on file prior to visit.     No Known Allergies  Past Medical History:  Diagnosis Date  . Alcoholism in family   . Allergic rhinitis, cause unspecified   . Unspecified hyperplasia of prostate without urinary obstruction and other lower urinary tract symptoms (LUTS)     Past Surgical History:  Procedure Laterality Date  . APPENDECTOMY  2003  . INGUINAL HERNIA REPAIR  1969  . INGUINAL HERNIA REPAIR  2006    Family History  Problem Relation Age of Onset  . Heart attack Father   . Coronary artery disease Father   . Colon cancer Maternal Grandmother   . Depression Maternal Grandfather     Severe  . Alcohol abuse      Family history    Social History   Social History  . Marital status: Married    Spouse name: N/A  . Number of children: 2  . Years of education: N/A   Occupational History  . Linen/Uniforms    Social History Main Topics  . Smoking status: Former Smoker    Years: 20.00    Quit date: 06/20/2001  . Smokeless tobacco: Never Used  . Alcohol use 0.0 oz/week     Comment: 1 glass of wine/month  . Drug use: No  . Sexual activity: Not on file   Other Topics Concern  . 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      The PMH, PSH, Social History, Family History, Medications, and allergies have been reviewed in [facility], and have been updated if relevant.   OBJECTIVE: BP 120/82 (BP Location: Left Arm, Patient Position: Sitting, Cuff Size: Normal)   Pulse 77   Temp 98.2 F (36.8 C) (Oral)   Wt 182 lb (82.6 kg)   SpO2 98%   BMI  27.27 kg/m   He appears well, vital signs are as noted. Ears normal.  Throat and pharynx normal.  Neck supple. No adenopathy in the neck. Nose is congested. Sinuses non tender. The chest is clear, without wheezes or rales.  ASSESSMENT:  {uri dx:315273::"viral upper respiratory illness"}  PLAN: Symptomatic therapy suggested: {resp plan:315236::"push fluids","rest","return office visit prn if symptoms persist or worsen"}. Lack of antibiotic effectiveness discussed with him. Call or return to clinic prn if these symptoms worsen or fail to improve as anticipated.

## 2015-12-10 NOTE — Patient Instructions (Signed)
Great to see you. Please keep me updated. 

## 2015-12-10 NOTE — Progress Notes (Signed)
SUBJECTIVE:  Chase Price is a 55 y.o. male pt of Dr. Ermalene SearingBedsole, new to me, who complains of congestion and nasal drainage for 12 days. He denies a history of anorexia, chest pain and shortness of breath and denies a history of asthma. Patient denies smoke cigarettes.  H/o allergic rhinitis- uses a nasal spray (cannot remember the name) and claritin OTC daily.    Denies any sinus pressure, cough, fever or tooth pain.  No SOB.   No current outpatient prescriptions on file prior to visit.   No current facility-administered medications on file prior to visit.     No Known Allergies  Past Medical History:  Diagnosis Date  . Alcoholism in family   . Allergic rhinitis, cause unspecified   . Unspecified hyperplasia of prostate without urinary obstruction and other lower urinary tract symptoms (LUTS)     Past Surgical History:  Procedure Laterality Date  . APPENDECTOMY  2003  . INGUINAL HERNIA REPAIR  1969  . INGUINAL HERNIA REPAIR  2006    Family History  Problem Relation Age of Onset  . Heart attack Father   . Coronary artery disease Father   . Colon cancer Maternal Grandmother   . Depression Maternal Grandfather     Severe  . Alcohol abuse      Family history    Social History   Social History  . Marital status: Married    Spouse name: N/A  . Number of children: 2  . Years of education: N/A   Occupational History  . Linen/Uniforms    Social History Main Topics  . Smoking status: Former Smoker    Years: 20.00    Quit date: 06/20/2001  . Smokeless tobacco: Never Used  . Alcohol use 0.0 oz/week     Comment: 1 glass of wine/month  . Drug use: No  . Sexual activity: Not on file   Other Topics Concern  . Not on file   Social History Narrative    Uniform/linen supplier    Married, 124  and 57 year old girls.      The PMH, PSH, Social History, Family History, Medications, and allergies [facility], and have been updated if relevant.  OBJECTIVE: BP  120/82 (BP Location: Left Arm, Patient Position: Sitting, Cuff Size: Normal)   Pulse 77   Temp 98.2 F (36.8 C) (Oral)   Wt 182 lb (82.6 kg)   SpO2 98%   BMI 27.27 kg/m   He appears well, vital signs are as noted. Ears normal.  Throat and pharynx normal.  Neck supple. No adenopathy in the neck. Nose is congested. Sinuses non tender. The chest is clear, without wheezes or rales.  ASSESSMENT:  viral upper respiratory illness and allergic rhinitis  PLAN: Add Xyzal, continue nasal spray. Symptomatic therapy suggested: push fluids, rest and return office visit prn if symptoms persist or worsen. Lack of antibiotic effectiveness discussed with him. Call or return to clinic prn if these symptoms worsen or fail to improve as anticipated.  Ruthe Mannanalia Aron, MD

## 2016-08-14 ENCOUNTER — Other Ambulatory Visit: Payer: Self-pay | Admitting: Family Medicine

## 2017-11-14 ENCOUNTER — Other Ambulatory Visit: Payer: Self-pay

## 2017-11-14 ENCOUNTER — Ambulatory Visit: Payer: Self-pay | Admitting: Family Medicine

## 2017-11-14 ENCOUNTER — Emergency Department (HOSPITAL_COMMUNITY)
Admission: EM | Admit: 2017-11-14 | Discharge: 2017-11-14 | Disposition: A | Discharge status: Home / Self Care | Payer: 59 | Attending: Emergency Medicine | Admitting: Emergency Medicine

## 2017-11-14 ENCOUNTER — Encounter (HOSPITAL_COMMUNITY): Payer: Self-pay

## 2017-11-14 DIAGNOSIS — R3121 Asymptomatic microscopic hematuria: Secondary | ICD-10-CM | POA: Diagnosis not present

## 2017-11-14 DIAGNOSIS — Z87891 Personal history of nicotine dependence: Secondary | ICD-10-CM | POA: Insufficient documentation

## 2017-11-14 DIAGNOSIS — R339 Retention of urine, unspecified: Secondary | ICD-10-CM | POA: Diagnosis not present

## 2017-11-14 DIAGNOSIS — Z79899 Other long term (current) drug therapy: Secondary | ICD-10-CM | POA: Insufficient documentation

## 2017-11-14 LAB — URINALYSIS, ROUTINE W REFLEX MICROSCOPIC
BACTERIA UA: NONE SEEN
BILIRUBIN URINE: NEGATIVE
Glucose, UA: NEGATIVE mg/dL
KETONES UR: NEGATIVE mg/dL
LEUKOCYTES UA: NEGATIVE
NITRITE: NEGATIVE
PROTEIN: NEGATIVE mg/dL
Specific Gravity, Urine: 1.009 (ref 1.005–1.030)
pH: 6 (ref 5.0–8.0)

## 2017-11-14 LAB — I-STAT CHEM 8, ED
BUN: 8 mg/dL (ref 6–20)
Calcium, Ion: 1.08 mmol/L — ABNORMAL LOW (ref 1.15–1.40)
Chloride: 100 mmol/L (ref 98–111)
Creatinine, Ser: 0.9 mg/dL (ref 0.61–1.24)
Glucose, Bld: 115 mg/dL — ABNORMAL HIGH (ref 70–99)
HEMATOCRIT: 48 % (ref 39.0–52.0)
Hemoglobin: 16.3 g/dL (ref 13.0–17.0)
POTASSIUM: 3.5 mmol/L (ref 3.5–5.1)
SODIUM: 138 mmol/L (ref 135–145)
TCO2: 26 mmol/L (ref 22–32)

## 2017-11-14 NOTE — Telephone Encounter (Signed)
Noted  

## 2017-11-14 NOTE — Discharge Instructions (Signed)
Follow up with the urologist in the office.  Return for abdominal pain, vomiting, fever.

## 2017-11-14 NOTE — ED Provider Notes (Signed)
[facility] EMERGENCY DEPARTMENT Provider Note   CSN: [id number] Arrival date & time: 11/14/17  4098     History   Chief Complaint Chief Complaint  Patient presents with  . Urinary Retention    HPI Chase Price is a 57 y.o. male.  57 yo M with a chief complaint of difficulty urinating.  This started yesterday and is gotten progressively worse.  He had an episode last night where he was able to urinate without difficulty but then has not been able to urinate since 8 last night.  Having difficulty moving his bowels as well.  Had a surgery for appendicitis in the remote past.  The history is provided by the patient and the spouse.  Illness  This is a new problem. The current episode started 2 days ago. The problem occurs constantly. The problem has been gradually worsening. Associated symptoms include abdominal pain. Pertinent negatives include no chest pain, no headaches and no shortness of breath. Nothing aggravates the symptoms. Nothing relieves the symptoms. He has tried nothing for the symptoms. The treatment provided no relief.    Past Medical History:  Diagnosis Date  . Alcoholism in family   . Allergic rhinitis, cause unspecified   . Unspecified hyperplasia of prostate without urinary obstruction and other lower urinary tract symptoms (LUTS)     Patient Active Problem List   Diagnosis Date Noted  . Sebaceous cyst 05/16/2015  . BPH (benign prostatic hypertrophy) 05/16/2015  . Allergic rhinitis 01/27/2007    Past Surgical History:  Procedure Laterality Date  . APPENDECTOMY  2003  . INGUINAL HERNIA REPAIR  1969  . INGUINAL HERNIA REPAIR  2006        Home Medications    Prior to Admission medications   Medication Sig Start Date End Date Taking? Authorizing Provider  loratadine (CLARITIN) 10 MG tablet Take 10 mg by mouth daily.    [provider]  XYZAL ALLERGY 24HR 5 MG tablet TAKE 1 TABLET (5 MG TOTAL) BY MOUTH EVERY EVENING. 08/16/16    Dianne Dun, MD    Family History Family History  Problem Relation Age of Onset  . Heart attack Father   . Coronary artery disease Father   . Colon cancer Maternal Grandmother   . Depression Maternal Grandfather        Severe  . Alcohol abuse Unknown        Family history    Social History Social History   Tobacco Use  . Smoking status: Former Smoker    Years: 20.00    Last attempt to quit: 06/20/2001    Years since quitting: 16.4  . Smokeless tobacco: Never Used  Substance Use Topics  . Alcohol use: Yes    Alcohol/week: 0.0 oz    Comment: 1 glass of wine/month  . Drug use: No     Allergies   Patient has no known allergies.   Review of Systems Review of Systems  Constitutional: Negative for chills and fever.  HENT: Negative for congestion and facial swelling.   Eyes: Negative for discharge and visual disturbance.  Respiratory: Negative for shortness of breath.   Cardiovascular: Negative for chest pain and palpitations.  Gastrointestinal: Positive for abdominal pain. Negative for diarrhea and vomiting.  Genitourinary: Positive for difficulty urinating.  Musculoskeletal: Negative for arthralgias and myalgias.  Skin: Negative for color change and rash.  Neurological: Negative for tremors, syncope and headaches.  Psychiatric/Behavioral: Negative for confusion and dysphoric mood.     Physical Exam  Updated Vital Signs BP 101/67   Pulse 63   Temp 98.5 F (36.9 C) (Oral)   Resp 18   SpO2 97%   Physical Exam  Constitutional: He is oriented to person, place, and time. He appears well-developed and well-nourished.  HENT:  Head: Normocephalic and atraumatic.  Eyes: Pupils are equal, round, and reactive to light. EOM are normal.  Neck: Normal range of motion. Neck supple. No JVD present.  Cardiovascular: Normal rate and regular rhythm. Exam reveals no gallop and no friction rub.  No murmur heard. Pulmonary/Chest: No respiratory distress. He has no wheezes.    Abdominal: He exhibits no distension and no mass. There is tenderness. There is no rebound and no guarding.  Midline abdominal incision, mildly distended dull to percussion, tenderness worse about the suprapubic region.  Musculoskeletal: Normal range of motion.  Neurological: He is alert and oriented to person, place, and time.  Skin: No rash noted. No pallor.  Psychiatric: He has a normal mood and affect. His behavior is normal.  Nursing note and vitals reviewed.    ED Treatments / Results  Labs (all labs ordered are listed, but only abnormal results are displayed) Labs Reviewed  URINALYSIS, ROUTINE W REFLEX MICROSCOPIC - Abnormal; Notable for the following components:      Result Value   Color, Urine STRAW (*)    Hgb urine dipstick MODERATE (*)    All other components within normal limits  I-STAT CHEM 8, ED - Abnormal; Notable for the following components:   Glucose, Bld 115 (*)    Calcium, Ion 1.08 (*)    All other components within normal limits    EKG None  Radiology No results found.  Procedures Procedures (including critical care time)  Medications Ordered in ED Medications - No data to display   Initial Impression / Assessment and Plan / ED Course  I have reviewed the triage vital signs and the nursing notes.  Pertinent labs & imaging results that were available during my care of the patient were reviewed by me and considered in my medical decision making (see chart for details).     57 yo M with a chief complaint of difficulty urinating.  Patient was found to have greater than a liter in his bladder on bladder scan.  Foley placed.  I stat Chem-8 with normal renal function. Will send off UA, reassess.   UA without infection, patient reassessed and feels completely better.  Will d/c home.  Urology follow up.   9:30 AM:  I have discussed the diagnosis/risks/treatment options with the patient and family and believe the pt to be eligible for discharge home to  follow-up with Urology. We also discussed returning to the ED immediately if new or worsening sx occur. We discussed the sx which are most concerning (e.g., sudden worsening pain, fever, inability to tolerate by mouth) that necessitate immediate return. Medications administered to the patient during their visit and any new prescriptions provided to the patient are listed below.  Medications given during this visit Medications - No data to display    The patient appears reasonably screen and/or stabilized for discharge and I doubt any other medical condition or other [facility] requiring further screening, evaluation, or treatment in the ED at this time prior to discharge.    Final Clinical Impressions(s) / ED Diagnoses   Final diagnoses:  Urinary retention  Asymptomatic microscopic hematuria    ED Discharge Orders    None       Melene PlanFloyd, Keasia Dubose,  DO 11/14/17 0930

## 2017-11-14 NOTE — ED Triage Notes (Signed)
Pt states starting yesterday had difficulty urinating, was only to get a small amount of urine out. Then last nigh was able to go again but now having difficulty. Pt has pressure when he tries to void. Pt does endorse testicular swelling.

## 2017-11-14 NOTE — Telephone Encounter (Signed)
  Called in c/o inability to urinate since 8:30 last night.   Had difficulty yesterday morning and went to the [facility].   They did a urine test and he will get the results in 2-3 days.   This morning he got up and cannot urinate at all.  Having abd discomfort in lower abd.  I referred him to the ED and explained to him why he needed to go there versus coming to the office.   He agreed with the plan and is going to [facility].  I let him know I would route a note to Dr. Ermalene SearingBedsole making her aware of his situation.  I route a note to Dr. Ermalene SearingBedsole.   Reason for Disposition . [1] Unable to urinate (or only a few drops) > 4 hours AND     [2] bladder feels very full (e.g., palpable bladder or strong urge to urinate)  Answer Assessment - Initial Assessment Questions 1. SYMPTOM: "What's the main symptom you're concerned about?" (e.g., frequency, incontinence)     Unable to urinate since yesterday.  [facility] and [facility] test.  Get results in 2-3 days.   Last time I urinated was last night at 8:30PM 2. ONSET: "When did the  Yesterday morning.       *No Answer* 3. PAIN: "Is there any pain?" If so, ask: "How bad is it?" (Scale: 1-10; mild, moderate, severe)     I'm having some abd pain.   I just can't urinate. 4. CAUSE: "What do you think is causing the symptoms?"     Never happened before.    5. OTHER SYMPTOMS: "Do you have any other symptoms?" (e.g., fever, flank pain, blood in urine, pain with urination)     Difficulty urination. 6. PREGNANCY: "Is there any chance you are pregnant?" "When was your last menstrual period?"     N/A  Protocols used: [facility]

## 2017-11-14 NOTE — Telephone Encounter (Signed)
Per chart review tab pt is at [place]. 

## 2018-12-11 ENCOUNTER — Observation Stay (HOSPITAL_COMMUNITY)
Admission: EM | Admit: 2018-12-11 | Discharge: 2018-12-12 | Disposition: A | Discharge status: Home / Self Care | Payer: 59 | Attending: Student | Admitting: Student

## 2018-12-11 ENCOUNTER — Encounter (HOSPITAL_COMMUNITY): Payer: Self-pay | Admitting: Emergency Medicine

## 2018-12-11 ENCOUNTER — Other Ambulatory Visit: Payer: Self-pay

## 2018-12-11 ENCOUNTER — Emergency Department (HOSPITAL_COMMUNITY): Payer: 59

## 2018-12-11 DIAGNOSIS — N400 Enlarged prostate without lower urinary tract symptoms: Secondary | ICD-10-CM | POA: Diagnosis not present

## 2018-12-11 DIAGNOSIS — J309 Allergic rhinitis, unspecified: Secondary | ICD-10-CM | POA: Insufficient documentation

## 2018-12-11 DIAGNOSIS — Z20828 Contact with and (suspected) exposure to other viral communicable diseases: Secondary | ICD-10-CM | POA: Insufficient documentation

## 2018-12-11 DIAGNOSIS — Z0184 Encounter for antibody response examination: Secondary | ICD-10-CM | POA: Insufficient documentation

## 2018-12-11 DIAGNOSIS — Z8719 Personal history of other diseases of the digestive system: Secondary | ICD-10-CM | POA: Diagnosis present

## 2018-12-11 DIAGNOSIS — Z87891 Personal history of nicotine dependence: Secondary | ICD-10-CM | POA: Diagnosis not present

## 2018-12-11 DIAGNOSIS — Z79899 Other long term (current) drug therapy: Secondary | ICD-10-CM | POA: Diagnosis not present

## 2018-12-11 DIAGNOSIS — K56600 Partial intestinal obstruction, unspecified as to cause: Principal | ICD-10-CM

## 2018-12-11 LAB — COMPREHENSIVE METABOLIC PANEL
ALT: 28 U/L (ref 0–44)
AST: 22 U/L (ref 15–41)
Albumin: 4.7 g/dL (ref 3.5–5.0)
Alkaline Phosphatase: 41 U/L (ref 38–126)
Anion gap: 11 (ref 5–15)
BUN: 11 mg/dL (ref 6–20)
CO2: 29 mmol/L (ref 22–32)
Calcium: 9.3 mg/dL (ref 8.9–10.3)
Chloride: 103 mmol/L (ref 98–111)
Creatinine, Ser: 0.91 mg/dL (ref 0.61–1.24)
GFR calc Af Amer: >60 mL/min (ref >60)
GFR calc non Af Amer: >60 mL/min (ref >60)
Glucose, Bld: 133 mg/dL — ABNORMAL HIGH (ref 70–99)
Potassium: 4.3 mmol/L (ref 3.5–5.1)
Sodium: 143 mmol/L (ref 135–145)
Total Bilirubin: 0.8 mg/dL (ref 0.3–1.2)
Total Protein: 8.2 g/dL — ABNORMAL HIGH (ref 6.5–8.1)

## 2018-12-11 LAB — URINALYSIS, ROUTINE W REFLEX MICROSCOPIC
Bilirubin Urine: NEGATIVE
Glucose, UA: NEGATIVE mg/dL
Hgb urine dipstick: NEGATIVE
Ketones, ur: NEGATIVE mg/dL
Leukocytes,Ua: NEGATIVE
Nitrite: NEGATIVE
Protein, ur: NEGATIVE mg/dL
Specific Gravity, Urine: >1.046 — ABNORMAL HIGH (ref 1.005–1.030)
pH: 6 (ref 5.0–8.0)

## 2018-12-11 LAB — CBC
HCT: 50.5 % (ref 39.0–52.0)
Hemoglobin: 16.5 g/dL (ref 13.0–17.0)
MCH: 31.3 pg (ref 26.0–34.0)
MCHC: 32.7 g/dL (ref 30.0–36.0)
MCV: 95.8 fL (ref 80.0–100.0)
Platelets: 300 10*3/uL (ref 150–400)
RBC: 5.27 MIL/uL (ref 4.22–5.81)
RDW: 13.2 % (ref 11.5–15.5)
WBC: 16.1 10*3/uL — ABNORMAL HIGH (ref 4.0–10.5)
nRBC: 0 % (ref 0.0–0.2)

## 2018-12-11 LAB — SARS CORONAVIRUS 2 BY RT PCR (HOSPITAL ORDER, PERFORMED IN ~~LOC~~ HOSPITAL LAB): SARS Coronavirus 2: NEGATIVE

## 2018-12-11 LAB — LIPASE, BLOOD: Lipase: 34 U/L (ref 11–51)

## 2018-12-11 MED ORDER — ONDANSETRON HCL 4 MG/2ML IJ SOLN: 4.0000 mg | Freq: Four times a day (QID) | INTRAMUSCULAR | Status: DC | PRN

## 2018-12-11 MED ORDER — ACETAMINOPHEN 650 MG RE SUPP: 650.0000 mg | Freq: Four times a day (QID) | RECTAL | Status: DC | PRN

## 2018-12-11 MED ORDER — ONDANSETRON HCL 4 MG PO TABS: 4.0000 mg | ORAL_TABLET | Freq: Four times a day (QID) | ORAL | Status: DC | PRN

## 2018-12-11 MED ORDER — PHENOL 1.4 % MT LIQD
1.0000 | OROMUCOSAL | Status: DC | PRN
  Administered 2018-12-11: 1 via OROMUCOSAL
  Filled 2018-12-11: qty 177

## 2018-12-11 MED ORDER — ONDANSETRON HCL 4 MG/2ML IJ SOLN
4.0000 mg | Freq: Once | INTRAMUSCULAR | Status: AC
  Administered 2018-12-11: 4 mg via INTRAVENOUS
  Filled 2018-12-11: qty 2

## 2018-12-11 MED ORDER — SODIUM CHLORIDE 0.9 % IV BOLUS
1000.0000 mL | Freq: Once | INTRAVENOUS | Status: AC
  Administered 2018-12-11: 1000 mL via INTRAVENOUS

## 2018-12-11 MED ORDER — DEXTROSE-NACL 5-0.9 % IV SOLN
INTRAVENOUS | Status: DC
  Administered 2018-12-11 – 2018-12-12 (×2): via INTRAVENOUS

## 2018-12-11 MED ORDER — ACETAMINOPHEN 325 MG PO TABS: 650.0000 mg | ORAL_TABLET | Freq: Four times a day (QID) | ORAL | Status: DC | PRN

## 2018-12-11 MED ORDER — IOHEXOL 300 MG/ML  SOLN
100.0000 mL | Freq: Once | INTRAMUSCULAR | Status: AC | PRN
  Administered 2018-12-11: 100 mL via INTRAVENOUS

## 2018-12-11 MED ORDER — SODIUM CHLORIDE (PF) 0.9 % IJ SOLN
INTRAMUSCULAR | Status: AC
Start: 2018-12-11 — End: 2018-12-12
  Filled 2018-12-11: qty 50

## 2018-12-11 NOTE — ED Notes (Signed)
Patient transported to CT 

## 2018-12-11 NOTE — ED Notes (Signed)
Patient aware we need urine sample. Patient will call out once ready to void. Urinal at bedside.

## 2018-12-11 NOTE — ED Provider Notes (Signed)
[facility]-EMERGENCY DEPT Provider Note   CSN: [id number] Arrival date & time: 12/11/18  1520    History   Chief Complaint Chief Complaint  Patient presents with   Emesis    HPI Rozetta Nunneryhomas Benedetti is a 58 y.o. male.     HPI Patient presents with nausea and vomiting.  Began today.  Had some diarrhea yesterday.  No fevers.  Some mild dull abdominal pain but states dull chest pain also from the vomiting.  Previous history of perforated appendectomy bilateral inguinal hernia repair.  States he had a bowel movement today however that was normal.  Pain is dull.  No fevers.  States there was some specks of blood with the vomiting. Past Medical History:  Diagnosis Date   Alcoholism in family    Allergic rhinitis, cause unspecified    Unspecified hyperplasia of prostate without urinary obstruction and other lower urinary tract symptoms (LUTS)     Patient Active Problem List   Diagnosis Date Noted   Sebaceous cyst 05/16/2015   BPH (benign prostatic hypertrophy) 05/16/2015   Allergic rhinitis 01/27/2007    Past Surgical History:  Procedure Laterality Date   APPENDECTOMY  2003   INGUINAL HERNIA REPAIR  1969   INGUINAL HERNIA REPAIR  2006        Home Medications    Prior to Admission medications   Medication Sig Start Date End Date Taking? Authorizing Provider  loratadine (CLARITIN) 10 MG tablet Take 10 mg by mouth daily.    [provider]  XYZAL ALLERGY 24HR 5 MG tablet TAKE 1 TABLET (5 MG TOTAL) BY MOUTH EVERY EVENING. 08/16/16   Dianne DunAron, Talia M, MD    Family History Family History  Problem Relation Age of Onset   Heart attack Father    Coronary artery disease Father    Colon cancer Maternal Grandmother    Depression Maternal Grandfather        Severe   Alcohol abuse Other        Family history    Social History Social History   Tobacco Use   Smoking status: Former Smoker    Years: 20.00    Quit date: 06/20/2001   Years since quitting: 17.4   Smokeless tobacco: Never Used  Substance Use Topics   Alcohol use: Yes    Alcohol/week: 0.0 standard drinks    Comment: 1 glass of wine/month   Drug use: No     Allergies   Patient has no known allergies.   Review of Systems Review of Systems  Constitutional: Negative for appetite change and fever.  HENT: Negative for congestion.   Respiratory: Negative for shortness of breath.   Cardiovascular: Negative for chest pain.  Gastrointestinal: Positive for abdominal pain, diarrhea, nausea and vomiting.  Genitourinary: Negative for flank pain.  Musculoskeletal: Negative for back pain.  Skin: Negative for rash.  Neurological: Negative for weakness.  Psychiatric/Behavioral: Negative for confusion.     Physical Exam Updated Vital Signs BP 132/70 (BP Location: Left Arm)    Pulse 67    Temp 99.6 F (37.6 C) (Oral)    Resp 17    SpO2 100%   Physical Exam Vitals signs and nursing note reviewed.  HENT:     Head: Normocephalic.  Cardiovascular:     Rate and Rhythm: Normal rate and regular rhythm.  Pulmonary:     Breath sounds: No wheezing, rhonchi or rales.  Abdominal:     Tenderness: There is abdominal tenderness.     Comments:  Mild diffuse abdominal tenderness.  No rebound or guarding.  No hernia palpated.  Musculoskeletal:     Right lower leg: No edema.     Left lower leg: No edema.  Skin:    Capillary Refill: Capillary refill takes less than 2 seconds.  Neurological:     Mental Status: He is alert and oriented to person, place, and time.      ED Treatments / Results  Labs (all labs ordered are listed, but only abnormal results are displayed) Labs Reviewed  COMPREHENSIVE METABOLIC PANEL - Abnormal; Notable for the following components:      Result Value   Glucose, Bld 133 (*)    Total Protein 8.2 (*)    All other components within normal limits  CBC - Abnormal; Notable for the following components:   WBC 16.1 (*)    All other  components within normal limits  LIPASE, BLOOD  URINALYSIS, ROUTINE W REFLEX MICROSCOPIC    EKG None  Radiology Ct Abdomen Pelvis W Contrast  Result Date: 12/11/2018 CLINICAL DATA:  Abdominal pain gastroenteritis or colitis EXAM: CT ABDOMEN AND PELVIS WITH CONTRAST TECHNIQUE: Multidetector CT imaging of the abdomen and pelvis was performed using the standard protocol following bolus administration of intravenous contrast. CONTRAST:  100mL OMNIPAQUE IOHEXOL 300 MG/ML  SOLN COMPARISON:  None. FINDINGS: Lower chest: The visualized heart size within normal limits. No pericardial fluid/thickening. No hiatal hernia. Tiny bullous change seen at the right lung base. Hepatobiliary: There is an 8 mm septated low-density lesion within the right liver lobe.The main portal vein is patent. No evidence of calcified gallstones, gallbladder wall thickening or biliary dilatation. Pancreas: Unremarkable. No pancreatic ductal dilatation or surrounding inflammatory changes. Spleen: Normal in size without focal abnormality. Adrenals/Urinary Tract: Both adrenal glands appear normal. The kidneys and collecting system appear normal without evidence of urinary tract calculus or hydronephrosis. Bladder is unremarkable. Stomach/Bowel: The stomach is normal in appearance. There is mildly dilated loops of jejunum and proximal to mid ileum measuring up to 3 cm in length. There is fecalization of the mid to distal ileal loops best seen on series 2, image 62. There appears to be a gradual area of narrowing seen within the right lower quadrant best seen on series 2, image 47. The remainder of the distal ileal loops are decompressed. Surgical sutures seen from prior appendectomy. There is air and stool seen within the right colon. This extends to the level of the rectum without focal area of bowel wall thickening or inflammatory changes. Vascular/Lymphatic: There are no enlarged mesenteric, retroperitoneal, or pelvic lymph nodes. No  significant vascular findings are present. Reproductive: The prostate appears to be heterogeneous and measures 6 cm and transverse dimension. Other: No evidence of abdominal wall mass or hernia. Musculoskeletal: No acute or significant osseous findings. IMPRESSION: 1. Partial small bowel obstruction with a gradual area of narrowing seen within the distal ileal loops as described above. This could be due to adhesions or stricture. No definite evidence of complete obstruction or focal bowel wall thickening. 2. Status post appendectomy Electronically Signed   By: Jonna ClarkBindu  Avutu M.D.   On: 12/11/2018 20:08   Dg Abdomen Acute W/chest  Result Date: 12/11/2018 CLINICAL DATA:  Vomiting EXAM: DG ABDOMEN ACUTE W/ 1V CHEST COMPARISON:  None. FINDINGS: There is no evidence of dilated bowel loops or free intraperitoneal air. No radiopaque calculi or other significant radiographic abnormality is seen. Heart size and mediastinal contours are within normal limits. Both lungs are clear. No airspace consolidation  or pleural effusion. IMPRESSION: Nonobstructive bowel gas pattern.  No acute cardiopulmonary disease. Electronically Signed   By: Prudencio Pair M.D.   On: 12/11/2018 18:29    Procedures Procedures (including critical care time)  Medications Ordered in ED Medications  sodium chloride (PF) 0.9 % injection (has no administration in time range)  ondansetron (ZOFRAN) injection 4 mg (4 mg Intravenous Given 12/11/18 1801)  sodium chloride 0.9 % bolus 1,000 mL (1,000 mLs Intravenous New Bag/Given 12/11/18 1801)  iohexol (OMNIPAQUE) 300 MG/ML solution 100 mL (100 mLs Intravenous Contrast Given 12/11/18 1940)     Initial Impression / Assessment and Plan / ED Course  I have reviewed the triage vital signs and the nursing notes.  Pertinent labs & imaging results that were available during my care of the patient were reviewed by me and considered in my medical decision making (see chart for details).        Patient  with nausea and vomiting some abdominal pain.  Previous abdominal surgeries.  White count mildly elevated at 16.  X-ray reassuring with CT scan showed partial small bowel obstruction.  Will admit to internal medicine for further evaluation and treatment.  He had a normal bowel movement today.  Final Clinical Impressions(s) / ED Diagnoses   Final diagnoses:  Partial small bowel obstruction [facility])    ED Discharge Orders    None       Davonna Belling, MD 12/11/18 2038

## 2018-12-11 NOTE — ED Notes (Signed)
ED TO INPATIENT HANDOFF REPORT  Name/Age/Gender Chase Price 58 y.o. male  Code Status   Home/SNF/Other Home  Chief Complaint emesis  Level of Care/Admitting Diagnosis ED Disposition    ED Disposition Condition Comment   Admit  [facility] [[id number]]  Level of Care: Telemetry [5]  Admit to tele based on following criteria: Monitor for Ischemic changes  Covid Evaluation: Asymptomatic Screening Protocol (No Symptoms)  Diagnosis: Partial small bowel obstruction [facility](HCC) [[id number]]) [[id number]]  Admitting Physician: Eduard ClosKAKRAKANDY, ARSHAD N [id number]]  Attending Physician: Eduard ClosKAKRAKANDY, ARSHAD N Florian.Pax[[id number]]  PT Class (Do Not Modify): Observation [104]  PT Acc Code (Do Not Modify): Observation [10022]       Medical History Past Medical History:  Diagnosis Date  . Alcoholism in family   . Allergic rhinitis, cause unspecified   . Unspecified hyperplasia of prostate without urinary obstruction and other lower urinary tract symptoms (LUTS)     Allergies No Known Allergies  IV Location/Drains/Wounds Patient Lines/Drains/Airways Status   Active Line/Drains/Airways    Name:   Placement date:   Placement time:   Site:   Days:   Peripheral IV 12/11/18 Right Antecubital   12/11/18    1737    Antecubital   less than 1   Urethral Catheter Debroah Looputh Richards RN Double-lumen 16 Fr.   11/14/17    0811    Double-lumen   392          Labs/Imaging Results for orders placed or performed during the hospital encounter of 12/11/18 (from the past 48 hour(s))  Lipase, blood     Status: None   Collection Time: 12/11/18  5:34 PM  Result Value Ref Range   Lipase 34 11 - 51 U/L    Comment: Performed at [facility], [address] [place], [place] [address]  Comprehensive metabolic panel     Status: Abnormal   Collection Time: 12/11/18  5:34 PM  Result Value Ref Range   Sodium 143 135 - 145 mmol/L   Potassium 4.3 3.5 - 5.1 mmol/L   Chloride 103 98 - 111 mmol/L   CO2 29  22 - 32 mmol/L   Glucose, Bld 133 (H) 70 - 99 mg/dL   BUN 11 6 - 20 mg/dL   Creatinine, Ser 9.140.91 0.61 - 1.24 mg/dL   Calcium 9.3 8.9 - 78.210.3 mg/dL   Total Protein 8.2 (H) 6.5 - 8.1 g/dL   Albumin 4.7 3.5 - 5.0 g/dL   AST 22 15 - 41 U/L   ALT 28 0 - 44 U/L   Alkaline Phosphatase 41 38 - 126 U/L   Total Bilirubin 0.8 0.3 - 1.2 mg/dL   GFR calc non Af Amer >60 >60 mL/min   GFR calc Af Amer >60 >60 mL/min   Anion gap 11 5 - 15    Comment: Performed at [facility], [address] [place], [place] [address]  CBC     Status: Abnormal   Collection Time: 12/11/18  5:34 PM  Result Value Ref Range   WBC 16.1 (H) 4.0 - 10.5 K/uL   RBC 5.27 4.22 - 5.81 MIL/uL   Hemoglobin 16.5 13.0 - 17.0 g/dL   HCT 30.850.5 65.739.0 - 84.652.0 %   MCV 95.8 80.0 - 100.0 fL   MCH 31.3 26.0 - 34.0 pg   MCHC 32.7 30.0 - 36.0 g/dL   RDW 96.213.2 95.211.5 - 84.115.5 %   Platelets 300 150 - 400 K/uL   nRBC 0.0 0.0 -  0.2 %    Comment: Performed at [facility], [place] [address], [place], [place] [address]   Ct Abdomen Pelvis W Contrast  Result Date: 12/11/2018 CLINICAL DATA:  Abdominal pain gastroenteritis or colitis EXAM: CT ABDOMEN AND PELVIS WITH CONTRAST TECHNIQUE: Multidetector CT imaging of the abdomen and pelvis was performed using the standard protocol following bolus administration of intravenous contrast. CONTRAST:  160mL OMNIPAQUE IOHEXOL 300 MG/ML  SOLN COMPARISON:  None. FINDINGS: Lower chest: The visualized heart size within normal limits. No pericardial fluid/thickening. No hiatal hernia. Tiny bullous change seen at the right lung base. Hepatobiliary: There is an 8 mm septated low-density lesion within the right liver lobe.The main portal vein is patent. No evidence of calcified gallstones, gallbladder wall thickening or biliary dilatation. Pancreas: Unremarkable. No pancreatic ductal dilatation or surrounding inflammatory changes. Spleen: Normal in size without focal abnormality.  Adrenals/Urinary Tract: Both adrenal glands appear normal. The kidneys and collecting system appear normal without evidence of urinary tract calculus or hydronephrosis. Bladder is unremarkable. Stomach/Bowel: The stomach is normal in appearance. There is mildly dilated loops of jejunum and proximal to mid ileum measuring up to 3 cm in length. There is fecalization of the mid to distal ileal loops best seen on series 2, image 62. There appears to be a gradual area of narrowing seen within the right lower quadrant best seen on series 2, image 47. The remainder of the distal ileal loops are decompressed. Surgical sutures seen from prior appendectomy. There is air and stool seen within the right colon. This extends to the level of the rectum without focal area of bowel wall thickening or inflammatory changes. Vascular/Lymphatic: There are no enlarged mesenteric, retroperitoneal, or pelvic lymph nodes. No significant vascular findings are present. Reproductive: The prostate appears to be heterogeneous and measures 6 cm and transverse dimension. Other: No evidence of abdominal wall mass or hernia. Musculoskeletal: No acute or significant osseous findings. IMPRESSION: 1. Partial small bowel obstruction with a gradual area of narrowing seen within the distal ileal loops as described above. This could be due to adhesions or stricture. No definite evidence of complete obstruction or focal bowel wall thickening. 2. Status post appendectomy Electronically Signed   By: Prudencio Pair M.D.   On: 12/11/2018 20:08   Dg Abdomen Acute W/chest  Result Date: 12/11/2018 CLINICAL DATA:  Vomiting EXAM: DG ABDOMEN ACUTE W/ 1V CHEST COMPARISON:  None. FINDINGS: There is no evidence of dilated bowel loops or free intraperitoneal air. No radiopaque calculi or other significant radiographic abnormality is seen. Heart size and mediastinal contours are within normal limits. Both lungs are clear. No airspace consolidation or pleural effusion.  IMPRESSION: Nonobstructive bowel gas pattern.  No acute cardiopulmonary disease. Electronically Signed   By: Prudencio Pair M.D.   On: 12/11/2018 18:29    Pending Labs Unresulted Labs (From admission, onward)    Start     Ordered   12/11/18 2039  SARS Coronavirus 2 [facility] order, Performed in [place] [facility] lab) Nasopharyngeal Nasopharyngeal Swab  (Symptomatic/High Risk of Exposure/Tier 1 Patients Labs with Precautions)  Once,   STAT    Question Answer Comment  Is this test for diagnosis or screening Diagnosis of ill patient   Symptomatic for COVID-19 as defined by CDC No   Hospitalized for COVID-19 No   Admitted to ICU for COVID-19 No   Previously tested for COVID-19 No   Resident in a congregate (group) care setting No   Employed in healthcare setting No  12/11/18 2039   12/11/18 1547  Urinalysis, Routine w reflex microscopic  ONCE - STAT,   STAT     12/11/18 1546   Signed and Held  HIV antibody (Routine Testing)  Tomorrow morning,   R     Signed and Held   Signed and Held  Comprehensive metabolic panel  Tomorrow morning,   R     Signed and Held   Signed and Held  CBC WITH DIFFERENTIAL  Tomorrow morning,   R     Signed and Held          Vitals/Pain Today's Vitals   12/11/18 2008 12/11/18 2030 12/11/18 2045 12/11/18 2100  BP: 132/70 130/71  124/73  Pulse: 67 75 70 65  Resp: 17   17  Temp:      TempSrc:      SpO2: 100% 99% 98% 98%  PainSc:        Isolation Precautions No active isolations  Medications Medications  sodium chloride (PF) 0.9 % injection (has no administration in time range)  dextrose 5 %-0.9 % sodium chloride infusion (has no administration in time range)  ondansetron (ZOFRAN) injection 4 mg (4 mg Intravenous Given 12/11/18 1801)  sodium chloride 0.9 % bolus 1,000 mL (0 mLs Intravenous Stopped 12/11/18 2105)  iohexol (OMNIPAQUE) 300 MG/ML solution 100 mL (100 mLs Intravenous Contrast Given 12/11/18 1940)    Mobility walks

## 2018-12-11 NOTE — H&P (Signed)
History and Physical    Chase Price ZOX:[id number] DOB: 05/29/1960 DOA: 12/11/2018  PCP: Excell SeltzerBedsole, Amy E, MD  Patient coming from: Home.  Chief Complaint: Nausea vomiting.  HPI: Chase Nunneryhomas Lascola is a 58 y.o. male with history of BPH and previous history of ruptured appendicitis presents to the ER with complaints of multiple episodes of vomiting since this morning.  No vomiting, some throat pain otherwise denies any abdominal pain.  Did move his bowels this afternoon.  Due to the persistent nausea patient comes to the ER.  ED Course: Acute abdominal series was unremarkable.  CT abdomen pelvis done shows partial small bowel obstruction with narrowing of the terminal ileum.  Patient was started on IV fluids and admitted for further observation.  Labs show glucose 133 creatinine 1.9 WBC 16.1 hemoglobin 16.5 COVID-19 was negative.  Review of Systems: As per HPI, rest all negative.   Past Medical History:  Diagnosis Date  . Alcoholism in family   . Allergic rhinitis, cause unspecified   . Unspecified hyperplasia of prostate without urinary obstruction and other lower urinary tract symptoms (LUTS)     Past Surgical History:  Procedure Laterality Date  . APPENDECTOMY  2003  . INGUINAL HERNIA REPAIR  1969  . INGUINAL HERNIA REPAIR  2006     reports that he quit smoking about 17 years ago. He quit after 20.00 years of use. He has never used smokeless tobacco. He reports current alcohol use. He reports that he does not use drugs.  No Known Allergies  Family History  Problem Relation Age of Onset  . Heart attack Father   . Coronary artery disease Father   . Colon cancer Maternal Grandmother   . Depression Maternal Grandfather        Severe  . Alcohol abuse Other        Family history    Prior to Admission medications   Medication Sig Start Date End Date Taking? Authorizing Provider  loratadine (CLARITIN) 10 MG tablet Take 10 mg by mouth daily.   Yes [provider]   tamsulosin (FLOMAX) 0.4 MG CAPS capsule Take 0.8 mg by mouth every evening. 12/07/18  Yes [provider]  XYZAL ALLERGY 24HR 5 MG tablet TAKE 1 TABLET (5 MG TOTAL) BY MOUTH EVERY EVENING. Patient not taking: Reported on 12/11/2018 08/16/16   Dianne DunAron, Talia M, MD    Physical Exam: Constitutional: Moderately built and nourished. Vitals:   12/11/18 1545 12/11/18 1739 12/11/18 2008  BP: 116/88 135/84 132/70  Pulse: 94 66 67  Resp: 18 18 17   Temp: 99.6 F (37.6 C)    TempSrc: Oral    SpO2: 98% 98% 100%   Eyes: Anicteric no pallor. ENMT: No discharge from the ears eyes nose or mouth. Neck: No mass felt.  No neck rigidity. Respiratory: No rhonchi or crepitations. Cardiovascular: S1-S2 heard. Abdomen: Soft nontender bowel sounds not appreciated.  No guarding or rigidity. Musculoskeletal: No edema. Skin: No rash. Neurologic: Alert awake oriented to time place and person.  Moves all extremities. Psychiatric: Appears normal.   Labs on Admission: I have personally reviewed following labs and imaging studies  CBC: Recent Labs  Lab 12/11/18 1734  WBC 16.1*  HGB 16.5  HCT 50.5  MCV 95.8  PLT 300   Basic Metabolic Panel: Recent Labs  Lab 12/11/18 1734  NA 143  K 4.3  CL 103  CO2 29  GLUCOSE 133*  BUN 11  CREATININE 0.91  CALCIUM 9.3   GFR: CrCl cannot  be calculated (Unknown ideal weight.). Liver Function Tests: Recent Labs  Lab 12/11/18 1734  AST 22  ALT 28  ALKPHOS 41  BILITOT 0.8  PROT 8.2*  ALBUMIN 4.7   Recent Labs  Lab 12/11/18 1734  LIPASE 34   No results for input(s): AMMONIA in the last 168 hours. Coagulation Profile: No results for input(s): INR, PROTIME in the last 168 hours. Cardiac Enzymes: No results for input(s): CKTOTAL, CKMB, CKMBINDEX, TROPONINI in the last 168 hours. BNP (last 3 results) No results for input(s): PROBNP in the last 8760 hours. HbA1C: No results for input(s): HGBA1C in the last 72 hours. CBG: No results for  input(s): GLUCAP in the last 168 hours. Lipid Profile: No results for input(s): CHOL, HDL, LDLCALC, TRIG, CHOLHDL, LDLDIRECT in the last 72 hours. Thyroid Function Tests: No results for input(s): TSH, T4TOTAL, FREET4, T3FREE, THYROIDAB in the last 72 hours. Anemia Panel: No results for input(s): VITAMINB12, FOLATE, FERRITIN, TIBC, IRON, RETICCTPCT in the last 72 hours. Urine analysis:    Component Value Date/Time   COLORURINE STRAW (A) 11/14/2017 0814   APPEARANCEUR CLEAR 11/14/2017 0814   LABSPEC 1.009 11/14/2017 0814   PHURINE 6.0 11/14/2017 0814   GLUCOSEU NEGATIVE 11/14/2017 0814   HGBUR MODERATE (A) 11/14/2017 0814   BILIRUBINUR NEGATIVE 11/14/2017 0814   KETONESUR NEGATIVE 11/14/2017 0814   PROTEINUR NEGATIVE 11/14/2017 0814   NITRITE NEGATIVE 11/14/2017 0814   LEUKOCYTESUR NEGATIVE 11/14/2017 0814   Sepsis Labs: @LABRCNTIP (procalcitonin:4,lacticidven:4) )No results found for this or any previous visit (from the past 240 hour(s)).   Radiological Exams on Admission: Ct Abdomen Pelvis W Contrast  Result Date: 12/11/2018 CLINICAL DATA:  Abdominal pain gastroenteritis or colitis EXAM: CT ABDOMEN AND PELVIS WITH CONTRAST TECHNIQUE: Multidetector CT imaging of the abdomen and pelvis was performed using the standard protocol following bolus administration of intravenous contrast. CONTRAST:  112mL OMNIPAQUE IOHEXOL 300 MG/ML  SOLN COMPARISON:  None. FINDINGS: Lower chest: The visualized heart size within normal limits. No pericardial fluid/thickening. No hiatal hernia. Tiny bullous change seen at the right lung base. Hepatobiliary: There is an 8 mm septated low-density lesion within the right liver lobe.The main portal vein is patent. No evidence of calcified gallstones, gallbladder wall thickening or biliary dilatation. Pancreas: Unremarkable. No pancreatic ductal dilatation or surrounding inflammatory changes. Spleen: Normal in size without focal abnormality. Adrenals/Urinary Tract: Both  adrenal glands appear normal. The kidneys and collecting system appear normal without evidence of urinary tract calculus or hydronephrosis. Bladder is unremarkable. Stomach/Bowel: The stomach is normal in appearance. There is mildly dilated loops of jejunum and proximal to mid ileum measuring up to 3 cm in length. There is fecalization of the mid to distal ileal loops best seen on series 2, image 62. There appears to be a gradual area of narrowing seen within the right lower quadrant best seen on series 2, image 47. The remainder of the distal ileal loops are decompressed. Surgical sutures seen from prior appendectomy. There is air and stool seen within the right colon. This extends to the level of the rectum without focal area of bowel wall thickening or inflammatory changes. Vascular/Lymphatic: There are no enlarged mesenteric, retroperitoneal, or pelvic lymph nodes. No significant vascular findings are present. Reproductive: The prostate appears to be heterogeneous and measures 6 cm and transverse dimension. Other: No evidence of abdominal wall mass or hernia. Musculoskeletal: No acute or significant osseous findings. IMPRESSION: 1. Partial small bowel obstruction with a gradual area of narrowing seen within the distal ileal loops  as described above. This could be due to adhesions or stricture. No definite evidence of complete obstruction or focal bowel wall thickening. 2. Status post appendectomy Electronically Signed   By: Jonna ClarkBindu  Avutu M.D.   On: 12/11/2018 20:08   Dg Abdomen Acute W/chest  Result Date: 12/11/2018 CLINICAL DATA:  Vomiting EXAM: DG ABDOMEN ACUTE W/ 1V CHEST COMPARISON:  None. FINDINGS: There is no evidence of dilated bowel loops or free intraperitoneal air. No radiopaque calculi or other significant radiographic abnormality is seen. Heart size and mediastinal contours are within normal limits. Both lungs are clear. No airspace consolidation or pleural effusion. IMPRESSION: Nonobstructive  bowel gas pattern.  No acute cardiopulmonary disease. Electronically Signed   By: Jonna ClarkBindu  Avutu M.D.   On: 12/11/2018 18:29     Assessment/Plan Principal Problem:   Partial small bowel obstruction (HCC)    1. Partial small bowel obstruction -we will keep patient n.p.o. IV fluids and a repeat KUB in the morning.  Consult general surgery in the morning.  If there is any episode of further vomiting we will keep NG tube. 2. History of BPH presently n.p.o. 3. History of allergic rhinitis.   DVT prophylaxis: SCDs. Code Status: Full code. Family Communication: Discussed with patient. Disposition Plan: Home. Consults called: We will consult general surgery. Admission status: Observation.   Eduard ClosArshad N Tatyanna Cronk MD [facility] Pager [phone].  If 7PM-7AM, please contact night-coverage [email or web address] Password [email or web address]  12/11/2018, 8:48 PM

## 2018-12-11 NOTE — ED Triage Notes (Signed)
Pt reports diarrhea yesterday. Today had acid reflux and vomiting since waking up. Pt has pain in throat from vomiting. reports saw dark red-brown in vomit today. Denies taking blood thinners.

## 2018-12-11 NOTE — ED Notes (Signed)
Patient transported to DG. 

## 2018-12-12 ENCOUNTER — Encounter (HOSPITAL_COMMUNITY): Payer: Self-pay | Admitting: General Surgery

## 2018-12-12 ENCOUNTER — Observation Stay (HOSPITAL_COMMUNITY): Payer: 59

## 2018-12-12 DIAGNOSIS — N400 Enlarged prostate without lower urinary tract symptoms: Secondary | ICD-10-CM

## 2018-12-12 DIAGNOSIS — K56600 Partial intestinal obstruction, unspecified as to cause: Secondary | ICD-10-CM | POA: Diagnosis not present

## 2018-12-12 LAB — COMPREHENSIVE METABOLIC PANEL
ALT: 22 U/L (ref 0–44)
AST: 14 U/L — ABNORMAL LOW (ref 15–41)
Albumin: 3.6 g/dL (ref 3.5–5.0)
Alkaline Phosphatase: 33 U/L — ABNORMAL LOW (ref 38–126)
Anion gap: 9 (ref 5–15)
BUN: 9 mg/dL (ref 6–20)
CO2: 25 mmol/L (ref 22–32)
Calcium: 8.3 mg/dL — ABNORMAL LOW (ref 8.9–10.3)
Chloride: 108 mmol/L (ref 98–111)
Creatinine, Ser: 0.8 mg/dL (ref 0.61–1.24)
GFR calc Af Amer: >60 mL/min (ref >60)
GFR calc non Af Amer: >60 mL/min (ref >60)
Glucose, Bld: 125 mg/dL — ABNORMAL HIGH (ref 70–99)
Potassium: 3.5 mmol/L (ref 3.5–5.1)
Sodium: 142 mmol/L (ref 135–145)
Total Bilirubin: 0.9 mg/dL (ref 0.3–1.2)
Total Protein: 6.3 g/dL — ABNORMAL LOW (ref 6.5–8.1)

## 2018-12-12 LAB — CBC WITH DIFFERENTIAL/PLATELET
Abs Immature Granulocytes: 0.04 10*3/uL (ref 0.00–0.07)
Basophils Absolute: 0 10*3/uL (ref 0.0–0.1)
Basophils Relative: 0 %
Eosinophils Absolute: 0.1 10*3/uL (ref 0.0–0.5)
Eosinophils Relative: 1 %
HCT: 42.5 % (ref 39.0–52.0)
Hemoglobin: 13.7 g/dL (ref 13.0–17.0)
Immature Granulocytes: 0 %
Lymphocytes Relative: 17 %
Lymphs Abs: 1.8 10*3/uL (ref 0.7–4.0)
MCH: 31.3 pg (ref 26.0–34.0)
MCHC: 32.2 g/dL (ref 30.0–36.0)
MCV: 97 fL (ref 80.0–100.0)
Monocytes Absolute: 1 10*3/uL (ref 0.1–1.0)
Monocytes Relative: 9 %
Neutro Abs: 8 10*3/uL — ABNORMAL HIGH (ref 1.7–7.7)
Neutrophils Relative %: 73 %
Platelets: 219 10*3/uL (ref 150–400)
RBC: 4.38 MIL/uL (ref 4.22–5.81)
RDW: 13.2 % (ref 11.5–15.5)
WBC: 10.9 10*3/uL — ABNORMAL HIGH (ref 4.0–10.5)
nRBC: 0 % (ref 0.0–0.2)

## 2018-12-12 LAB — HIV ANTIBODY (ROUTINE TESTING W REFLEX): HIV Screen 4th Generation wRfx: NONREACTIVE

## 2018-12-12 NOTE — Progress Notes (Signed)
Pt discharged home in stable condition. Discharge instructions given. Pt verbalized understanding. No immediate questions or concerns at this time. Discharged from unit via wheelchair.  

## 2018-12-12 NOTE — Consult Note (Signed)
Chase Price 1960/05/06  [id number].    Requesting MD: Dr. Gean Birchwood Chief Complaint/Reason for Consult: pSBO  HPI:  This is a pleasant 58 yo white male with no significant PMH who had an ex lap in 2003 for suspected diverticulitis but found to have appendicitis.  He had an appendectomy.  He states he began having some emesis yesterday morning.  It stopped around midday and he had a BM at the time.  It returned later in the day.  No definite abdominal pain really.  He ate a lot peanuts on Sunday had a normal breakfast and lunch yesterday, but no dinner.  He came to the [facility] for evaluation.  He had a CT scan that revealed a possible psBO or just some thickening of the TI.  He has been admitted and since began passing flatus.  We have been asked to evaluate for further recommendations.  ROS: ROS: Please see HPI, otherwise negative.  Family History  Problem Relation Age of Onset  . Heart attack Father   . Coronary artery disease Father   . Colon cancer Maternal Grandmother   . Depression Maternal Grandfather        Severe  . Alcohol abuse Other        Family history    Past Medical History:  Diagnosis Date  . Alcoholism in family   . Allergic rhinitis, cause unspecified   . Unspecified hyperplasia of prostate without urinary obstruction and other lower urinary tract symptoms (LUTS)     Past Surgical History:  Procedure Laterality Date  . APPENDECTOMY  2003  . [place]  . INGUINAL HERNIA REPAIR  2006    Social History:  reports that he quit smoking about 17 years ago. He quit after 20.00 years of use. He has never used smokeless tobacco. He reports current alcohol use. He reports that he does not use drugs.  Allergies: No Known Allergies  Medications Prior to Admission  Medication Sig Dispense Refill  . loratadine (CLARITIN) 10 MG tablet Take 10 mg by mouth daily.    . tamsulosin (FLOMAX) 0.4 MG CAPS capsule Take 0.8 mg by mouth every evening.     Marland Kitchen XYZAL ALLERGY 24HR 5 MG tablet TAKE 1 TABLET (5 MG TOTAL) BY MOUTH EVERY EVENING. (Patient not taking: Reported on 12/11/2018) 30 tablet 3     Physical Exam: Blood pressure 101/61, pulse 63, temperature (!) 97.4 F (36.3 C), temperature source Oral, resp. rate 15, height 5\' 10"  (1.778 m), weight 77.9 kg, SpO2 97 %. General: pleasant, WD, WN white male who is laying in bed in NAD HEENT: head is normocephalic, atraumatic.  Sclera are noninjected.  PERRL.  Ears and nose without any masses or lesions.  Mouth is pink and moist Heart: regular, rate, and rhythm.  Normal s1,s2. No obvious murmurs, gallops, or rubs noted.  Palpable radial and pedal pulses bilaterally Lungs: CTAB, no wheezes, rhonchi, or rales noted.  Respiratory effort nonlabored Abd: soft, NT, ND, +BS, no masses, hernias, or organomegaly MS: all 4 extremities are symmetrical with no cyanosis, clubbing, or edema. Skin: warm and dry with no masses, lesions, or rashes Psych: A&Ox3 with an appropriate affect.   Results for orders placed or performed during the hospital encounter of 12/11/18 (from the past 48 hour(s))  Lipase, blood     Status: None   Collection Time: 12/11/18  5:34 PM  Result Value Ref Range   Lipase 34 11 - 51 U/L  Comment: Performed at [facility], [address] [place], [place] [address]  Comprehensive metabolic panel     Status: Abnormal   Collection Time: 12/11/18  5:34 PM  Result Value Ref Range   Sodium 143 135 - 145 mmol/L   Potassium 4.3 3.5 - 5.1 mmol/L   Chloride 103 98 - 111 mmol/L   CO2 29 22 - 32 mmol/L   Glucose, Bld 133 (H) 70 - 99 mg/dL   BUN 11 6 - 20 mg/dL   Creatinine, Ser 6.040.91 0.61 - 1.24 mg/dL   Calcium 9.3 8.9 - 54.010.3 mg/dL   Total Protein 8.2 (H) 6.5 - 8.1 g/dL   Albumin 4.7 3.5 - 5.0 g/dL   AST 22 15 - 41 U/L   ALT 28 0 - 44 U/L   Alkaline Phosphatase 41 38 - 126 U/L   Total Bilirubin 0.8 0.3 - 1.2 mg/dL   GFR calc non Af Amer >60 >60 mL/min   GFR calc  Af Amer >60 >60 mL/min   Anion gap 11 5 - 15    Comment: Performed at [facility], [address] [place], [place] [address]  CBC     Status: Abnormal   Collection Time: 12/11/18  5:34 PM  Result Value Ref Range   WBC 16.1 (H) 4.0 - 10.5 K/uL   RBC 5.27 4.22 - 5.81 MIL/uL   Hemoglobin 16.5 13.0 - 17.0 g/dL   HCT 14.750.5 82.939.0 - 56.252.0 %   MCV 95.8 80.0 - 100.0 fL   MCH 31.3 26.0 - 34.0 pg   MCHC 32.7 30.0 - 36.0 g/dL   RDW 13.013.2 86.511.5 - 78.415.5 %   Platelets 300 150 - 400 K/uL   nRBC 0.0 0.0 - 0.2 %    Comment: Performed at [facility], [address] [place], [place] [address]  Urinalysis, Routine w reflex microscopic     Status: Abnormal   Collection Time: 12/11/18  5:34 PM  Result Value Ref Range   Color, Urine YELLOW YELLOW   APPearance CLEAR CLEAR   Specific Gravity, Urine >1.046 (H) 1.005 - 1.030   pH 6.0 5.0 - 8.0   Glucose, UA NEGATIVE NEGATIVE mg/dL   Hgb urine dipstick NEGATIVE NEGATIVE   Bilirubin Urine NEGATIVE NEGATIVE   Ketones, ur NEGATIVE NEGATIVE mg/dL   Protein, ur NEGATIVE NEGATIVE mg/dL   Nitrite NEGATIVE NEGATIVE   Leukocytes,Ua NEGATIVE NEGATIVE    Comment: Performed at [facility], [address] [place], [place] [address]  SARS Coronavirus 2 [facility](Hospital order, Performed in [facility] hospital lab) Nasopharyngeal     Status: None   Collection Time: 12/11/18  9:15 PM   Specimen: Nasopharyngeal  Result Value Ref Range   SARS Coronavirus 2 NEGATIVE NEGATIVE    Comment: (NOTE) If result is NEGATIVE SARS-CoV-2 target nucleic acids are NOT DETECTED. The SARS-CoV-2 RNA is generally detectable in upper and lower  respiratory specimens during the acute phase of infection. The lowest  concentration of SARS-CoV-2 viral copies this assay can detect is 250  copies / mL. A negative result does not preclude SARS-CoV-2 infection  and should not be used as the sole basis for treatment or other  patient management  decisions.  A negative result may occur with  improper specimen collection / handling, submission of specimen other  than nasopharyngeal swab, presence of viral mutation(s) within the  areas targeted by this assay, and inadequate number of viral copies  (<250 copies /  mL). A negative result must be combined with clinical  observations, patient history, and epidemiological information. If result is POSITIVE SARS-CoV-2 target nucleic acids are DETECTED. The SARS-CoV-2 RNA is generally detectable in upper and lower  respiratory specimens dur ing the acute phase of infection.  Positive  results are indicative of active infection with SARS-CoV-2.  Clinical  correlation with patient history and other diagnostic information is  necessary to determine patient infection status.  Positive results do  not rule out bacterial infection or co-infection with other viruses. If result is PRESUMPTIVE POSTIVE SARS-CoV-2 nucleic acids MAY BE PRESENT.   A presumptive positive result was obtained on the submitted specimen  and confirmed on repeat testing.  While 2019 novel coronavirus  (SARS-CoV-2) nucleic acids may be present in the submitted sample  additional confirmatory testing may be necessary for epidemiological  and / or clinical management purposes  to differentiate between  SARS-CoV-2 and other Sarbecovirus currently known to infect humans.  If clinically indicated additional testing with an alternate test  methodology [phone]) is advised. The SARS-CoV-2 RNA is generally  detectable in upper and lower respiratory sp ecimens during the acute  phase of infection. The expected result is Negative. Fact Sheet for Patients:  BoilerBrush.com.cy Fact Sheet for Healthcare Providers: https://pope.com/ This test is not yet approved or cleared by the [place] FDA and has been authorized for detection and/or diagnosis of SARS-CoV-2 by FDA under an  Emergency Use Authorization (EUA).  This EUA will remain in effect (meaning this test can be used) for the duration of the COVID-19 declaration under Section 564(b)(1) of the Act, 21 U.S.C. section 360bbb-3(b)(1), unless the authorization is terminated or revoked sooner. Performed at [facility], [address], [place], [place] [address]   Comprehensive metabolic panel     Status: Abnormal   Collection Time: 12/12/18  4:21 AM  Result Value Ref Range   Sodium 142 135 - 145 mmol/L   Potassium 3.5 3.5 - 5.1 mmol/L    Comment: NO VISIBLE HEMOLYSIS DELTA CHECK NOTED    Chloride 108 98 - 111 mmol/L   CO2 25 22 - 32 mmol/L   Glucose, Bld 125 (H) 70 - 99 mg/dL   BUN 9 6 - 20 mg/dL   Creatinine, Ser 9.56 0.61 - 1.24 mg/dL   Calcium 8.3 (L) 8.9 - 10.3 mg/dL   Total Protein 6.3 (L) 6.5 - 8.1 g/dL   Albumin 3.6 3.5 - 5.0 g/dL   AST 14 (L) 15 - 41 U/L   ALT 22 0 - 44 U/L   Alkaline Phosphatase 33 (L) 38 - 126 U/L   Total Bilirubin 0.9 0.3 - 1.2 mg/dL   GFR calc non Af Amer >60 >60 mL/min   GFR calc Af Amer >60 >60 mL/min   Anion gap 9 5 - 15    Comment: Performed at [facility], [address] [place], [place] [address]  CBC WITH DIFFERENTIAL     Status: Abnormal   Collection Time: 12/12/18  4:21 AM  Result Value Ref Range   WBC 10.9 (H) 4.0 - 10.5 K/uL   RBC 4.38 4.22 - 5.81 MIL/uL   Hemoglobin 13.7 13.0 - 17.0 g/dL   HCT 65.7 84.6 - 96.2 %   MCV 97.0 80.0 - 100.0 fL   MCH 31.3 26.0 - 34.0 pg   MCHC 32.2 30.0 - 36.0 g/dL   RDW 95.2 84.1 - 32.4 %   Platelets 219 150 - 400 K/uL  nRBC 0.0 0.0 - 0.2 %   Neutrophils Relative % 73 %   Neutro Abs 8.0 (H) 1.7 - 7.7 K/uL   Lymphocytes Relative 17 %   Lymphs Abs 1.8 0.7 - 4.0 K/uL   Monocytes Relative 9 %   Monocytes Absolute 1.0 0.1 - 1.0 K/uL   Eosinophils Relative 1 %   Eosinophils Absolute 0.1 0.0 - 0.5 K/uL   Basophils Relative 0 %   Basophils Absolute 0.0 0.0 - 0.1 K/uL   Immature  Granulocytes 0 %   Abs Immature Granulocytes 0.04 0.00 - 0.07 K/uL    Comment: Performed at [facility], [address] [place], [place] [address]   Dg Abd 1 View  Result Date: 12/12/2018 CLINICAL DATA:  Follow-up small bowel obstruction EXAM: ABDOMEN - 1 VIEW COMPARISON:  KUB and CT scans from December 11, 2018 FINDINGS: The mildly prominent loops of small bowel in the left side of the abdomen measure up to 3.4 cm today, mildly improved in the interval. Colonic gas is seen to the level the rectum. IMPRESSION: Improving small bowel obstruction. Electronically Signed   By: Gerome Samavid  Williams III M.D   On: 12/12/2018 08:26   Ct Abdomen Pelvis W Contrast  Result Date: 12/11/2018 CLINICAL DATA:  Abdominal pain gastroenteritis or colitis EXAM: CT ABDOMEN AND PELVIS WITH CONTRAST TECHNIQUE: Multidetector CT imaging of the abdomen and pelvis was performed using the standard protocol following bolus administration of intravenous contrast. CONTRAST:  100mL OMNIPAQUE IOHEXOL 300 MG/ML  SOLN COMPARISON:  None. FINDINGS: Lower chest: The visualized heart size within normal limits. No pericardial fluid/thickening. No hiatal hernia. Tiny bullous change seen at the right lung base. Hepatobiliary: There is an 8 mm septated low-density lesion within the right liver lobe.The main portal vein is patent. No evidence of calcified gallstones, gallbladder wall thickening or biliary dilatation. Pancreas: Unremarkable. No pancreatic ductal dilatation or surrounding inflammatory changes. Spleen: Normal in size without focal abnormality. Adrenals/Urinary Tract: Both adrenal glands appear normal. The kidneys and collecting system appear normal without evidence of urinary tract calculus or hydronephrosis. Bladder is unremarkable. Stomach/Bowel: The stomach is normal in appearance. There is mildly dilated loops of jejunum and proximal to mid ileum measuring up to 3 cm in length. There is fecalization of the mid to  distal ileal loops best seen on series 2, image 62. There appears to be a gradual area of narrowing seen within the right lower quadrant best seen on series 2, image 47. The remainder of the distal ileal loops are decompressed. Surgical sutures seen from prior appendectomy. There is air and stool seen within the right colon. This extends to the level of the rectum without focal area of bowel wall thickening or inflammatory changes. Vascular/Lymphatic: There are no enlarged mesenteric, retroperitoneal, or pelvic lymph nodes. No significant vascular findings are present. Reproductive: The prostate appears to be heterogeneous and measures 6 cm and transverse dimension. Other: No evidence of abdominal wall mass or hernia. Musculoskeletal: No acute or significant osseous findings. IMPRESSION: 1. Partial small bowel obstruction with a gradual area of narrowing seen within the distal ileal loops as described above. This could be due to adhesions or stricture. No definite evidence of complete obstruction or focal bowel wall thickening. 2. Status post appendectomy Electronically Signed   By: Jonna ClarkBindu  Avutu M.D.   On: 12/11/2018 20:08   Dg Abdomen Acute W/chest  Result Date: 12/11/2018 CLINICAL DATA:  Vomiting EXAM: DG ABDOMEN ACUTE W/ 1V CHEST COMPARISON:  None. FINDINGS: There  is no evidence of dilated bowel loops or free intraperitoneal air. No radiopaque calculi or other significant radiographic abnormality is seen. Heart size and mediastinal contours are within normal limits. Both lungs are clear. No airspace consolidation or pleural effusion. IMPRESSION: Nonobstructive bowel gas pattern.  No acute cardiopulmonary disease. Electronically Signed   By: Jonna ClarkBindu  Avutu M.D.   On: 12/11/2018 18:29      Assessment/Plan pSBO The patient's follow up plain films this morning show resolution of the pSBO seen on CT scan last night.  He is currently passing flatus and having no further nausea or vomiting.  He can have clear  liquids and adv to a soft diet as he tolerates.  He is wanting to go home today and I think this is ok as long as he can at least keep liquids down.  No surgical indications otherwise.   FEN - CLD, adv as tolerates to soft VTE - SCDs ID - none  Letha CapeKelly E Shadiamond Koska, [facility] 12/12/2018, 12:39 PM Pager: [phone]

## 2018-12-12 NOTE — Discharge Summary (Signed)
Physician Discharge Summary  Chase Nunneryhomas Bergin JYN:[id number] DOB: 11/07/1960 DOA: 12/11/2018  PCP: Excell SeltzerBedsole, Amy E, MD  Admit date: 12/11/2018 Discharge date: 12/12/2018  Admitted From: Home Disposition: Home  Recommendations for Outpatient Follow-up:  1. Follow up with PCP in 1-2 weeks 2. Please obtain CBC/BMP/Mag at follow up 3. Please follow up on the following pending results: None  Home Health: None Equipment/Devices: None  Discharge Condition: Stable CODE STATUS: Full code  Hospital Course: 58 year old male with history of BPH and environmental allergy presented with nausea, vomiting and occasional diarrhea and found to have mild partial SBO on CT abdomen and pelvis.  Reportedly started having nausea and emesis about 3 AM on the day of presentation.  However, he had bowel movement about 12:30 PM the same day.  He has history of appendectomy and hernia repairs.  Vital signs stable.  CBC and CMP not impressive.  UA concerning for dehydration.  Had repeat KUB the next morning with improvement in his SBO with gas pattern throughout.  Evaluated by general surgery who cleared him for discharge if he tolerates clear liquid diet that he did.  He was discharged on full liquid diet with an instruction to advance to soft diet, then regular diet as tolerated.  Recommended follow-up with PCP as well.  Discharge Diagnoses:  Partial SBO: Resolved. -Cleared for discharge by general surgery. -Discharge with dietary instruction return precautions. -Follow-up with PCP in 1 week.  BPH: Stable. -Discharged on home meds.  Discharge Instructions  Discharge Instructions    Call MD for:  persistant dizziness or light-headedness   Complete by: As directed    Call MD for:  persistant nausea and vomiting   Complete by: As directed    Call MD for:  severe uncontrolled pain   Complete by: As directed    Call MD for:  temperature >100.4   Complete by: As directed    Diet full liquid   Complete by: As  directed    Discharge instructions   Complete by: As directed    It has been a pleasure taking care of you! You were admitted with nausea, vomiting and abdominal pain likely due to partial small bowel obstruction that seems to have resolved without intervention.  We recommend slowly advancing your diet from clear liquid to full liquid and then soft diet as tolerated.  If no issues, you may go back to regular diet either tomorrow or the day after. Please review your new medication list and the directions before you take your medications. Please call your primary care office as soon as possible to schedule hospital follow-up visit in 1 to 2 weeks.  Take care,   Increase activity slowly   Complete by: As directed      Allergies as of 12/12/2018   No Known Allergies     Medication List    TAKE these medications   loratadine 10 MG tablet Commonly known as: CLARITIN Take 10 mg by mouth daily.   tamsulosin 0.4 MG Caps capsule Commonly known as: FLOMAX Take 0.8 mg by mouth every evening.   Xyzal Allergy 24HR 5 MG tablet Generic drug: levocetirizine TAKE 1 TABLET (5 MG TOTAL) BY MOUTH EVERY EVENING.      Follow-up Information    Excell SeltzerBedsole, Amy E, MD. Schedule an appointment as soon as possible for a visit in 1 week(s).   Specialty: Family Medicine Contact information: [address] [place] [id number] [phone]  Consultations:  General surgery  Procedures/Studies:  2D Echo: None  Dg Abd 1 View  Result Date: 12/12/2018 CLINICAL DATA:  Follow-up small bowel obstruction EXAM: ABDOMEN - 1 VIEW COMPARISON:  KUB and CT scans from December 11, 2018 FINDINGS: The mildly prominent loops of small bowel in the left side of the abdomen measure up to 3.4 cm today, mildly improved in the interval. Colonic gas is seen to the level the rectum. IMPRESSION: Improving small bowel obstruction. Electronically Signed   By: Gerome Samavid  Williams III M.D   On: 12/12/2018 08:26    Ct Abdomen Pelvis W Contrast  Result Date: 12/11/2018 CLINICAL DATA:  Abdominal pain gastroenteritis or colitis EXAM: CT ABDOMEN AND PELVIS WITH CONTRAST TECHNIQUE: Multidetector CT imaging of the abdomen and pelvis was performed using the standard protocol following bolus administration of intravenous contrast. CONTRAST:  100mL OMNIPAQUE IOHEXOL 300 MG/ML  SOLN COMPARISON:  None. FINDINGS: Lower chest: The visualized heart size within normal limits. No pericardial fluid/thickening. No hiatal hernia. Tiny bullous change seen at the right lung base. Hepatobiliary: There is an 8 mm septated low-density lesion within the right liver lobe.The main portal vein is patent. No evidence of calcified gallstones, gallbladder wall thickening or biliary dilatation. Pancreas: Unremarkable. No pancreatic ductal dilatation or surrounding inflammatory changes. Spleen: Normal in size without focal abnormality. Adrenals/Urinary Tract: Both adrenal glands appear normal. The kidneys and collecting system appear normal without evidence of urinary tract calculus or hydronephrosis. Bladder is unremarkable. Stomach/Bowel: The stomach is normal in appearance. There is mildly dilated loops of jejunum and proximal to mid ileum measuring up to 3 cm in length. There is fecalization of the mid to distal ileal loops best seen on series 2, image 62. There appears to be a gradual area of narrowing seen within the right lower quadrant best seen on series 2, image 47. The remainder of the distal ileal loops are decompressed. Surgical sutures seen from prior appendectomy. There is air and stool seen within the right colon. This extends to the level of the rectum without focal area of bowel wall thickening or inflammatory changes. Vascular/Lymphatic: There are no enlarged mesenteric, retroperitoneal, or pelvic lymph nodes. No significant vascular findings are present. Reproductive: The prostate appears to be heterogeneous and measures 6 cm and  transverse dimension. Other: No evidence of abdominal wall mass or hernia. Musculoskeletal: No acute or significant osseous findings. IMPRESSION: 1. Partial small bowel obstruction with a gradual area of narrowing seen within the distal ileal loops as described above. This could be due to adhesions or stricture. No definite evidence of complete obstruction or focal bowel wall thickening. 2. Status post appendectomy Electronically Signed   By: Jonna ClarkBindu  Avutu M.D.   On: 12/11/2018 20:08   Dg Abdomen Acute W/chest  Result Date: 12/11/2018 CLINICAL DATA:  Vomiting EXAM: DG ABDOMEN ACUTE W/ 1V CHEST COMPARISON:  None. FINDINGS: There is no evidence of dilated bowel loops or free intraperitoneal air. No radiopaque calculi or other significant radiographic abnormality is seen. Heart size and mediastinal contours are within normal limits. Both lungs are clear. No airspace consolidation or pleural effusion. IMPRESSION: Nonobstructive bowel gas pattern.  No acute cardiopulmonary disease. Electronically Signed   By: Jonna ClarkBindu  Avutu M.D.   On: 12/11/2018 18:29      Subjective: No major events overnight of this morning.  Nausea, vomiting and diarrhea resolved.  No cardiopulmonary or GU symptoms.  KUB with improvement in his partial obstruction.  Tolerated clear liquid diet prior to discharge.  Cleared  for discharge by general surgery.   Discharge Exam: Vitals:   12/12/18 0509 12/12/18 1330  BP: 101/61 111/75  Pulse: 63 (!) 56  Resp: 15 16  Temp: (!) 97.4 F (36.3 C) 98.4 F (36.9 C)  SpO2: 97% 98%    GENERAL: No acute distress.  Appears well.  HEENT: MMM.  Vision and hearing grossly intact.  NECK: Supple.  No JVD.  LUNGS:  No IWOB. Good air movement bilaterally. HEART:  RRR. Heart sounds normal.  ABD: Bowel sounds present. Soft. Non tender.  MSK/EXT:  Moves all extremities. No apparent deformity. No edema bilaterally. SKIN: no apparent skin lesion or wound NEURO: Awake, alert and oriented  appropriately.  No gross deficit.  PSYCH: Calm. Normal affect.   The results of significant diagnostics from this hospitalization (including imaging, microbiology, ancillary and laboratory) are listed below for reference.     Microbiology: Recent Results (from the past 240 hour(s))  SARS Coronavirus 2 [facility](Hospital order, [facility]) Nasopharyngeal     Status: None   Collection Time: 12/11/18  9:15 PM   Specimen: Nasopharyngeal  Result Value Ref Range Status   SARS Coronavirus 2 NEGATIVE NEGATIVE Final    Comment: (NOTE) If result is NEGATIVE SARS-CoV-2 target nucleic acids are NOT DETECTED. The SARS-CoV-2 RNA is generally detectable in upper and lower  respiratory specimens during the acute phase of infection. The lowest  concentration of SARS-CoV-2 viral copies this assay can detect is 250  copies / mL. A negative result does not preclude SARS-CoV-2 infection  and should not be used as the sole basis for treatment or other  patient management decisions.  A negative result may occur with  improper specimen collection / handling, submission of specimen other  than nasopharyngeal swab, presence of viral mutation(s) within the  areas targeted by this assay, and inadequate number of viral copies  (<250 copies / mL). A negative result must be combined with clinical  observations, patient history, and epidemiological information. If result is POSITIVE SARS-CoV-2 target nucleic acids are DETECTED. The SARS-CoV-2 RNA is generally detectable in upper and lower  respiratory specimens dur ing the acute phase of infection.  Positive  results are indicative of active infection with SARS-CoV-2.  Clinical  correlation with patient history and other diagnostic information is  necessary to determine patient infection status.  Positive results do  not rule out bacterial infection or co-infection with other viruses. If result is PRESUMPTIVE POSTIVE SARS-CoV-2 nucleic acids  MAY BE PRESENT.   A presumptive positive result was obtained on the submitted specimen  and confirmed on repeat testing.  While 2019 novel coronavirus  (SARS-CoV-2) nucleic acids may be present in the submitted sample  additional confirmatory testing may be necessary for epidemiological  and / or clinical management purposes  to differentiate between  SARS-CoV-2 and other Sarbecovirus currently known to infect humans.  If clinically indicated additional testing with an alternate test  methodology [phone]([id number]) is advised. The SARS-CoV-2 RNA is generally  detectable in upper and lower respiratory sp ecimens during the acute  phase of infection. The expected result is Negative. Fact Sheet for Patients:  BoilerBrush.com.cyhttps://www.fda.gov/media/136312/download Fact Sheet for Healthcare Providers: https://pope.com/https://www.fda.gov/media/136313/download This test is not yet approved or cleared by the [place] FDA and has been authorized for detection and/or diagnosis of SARS-CoV-2 by FDA under an Emergency Use Authorization (EUA).  This EUA will remain in effect (meaning this test can be used) for the duration of the COVID-19 declaration under Section  564(b)(1) of the Act, 21 U.S.C. section 360bbb-3(b)(1), unless the authorization is terminated or revoked sooner. Performed at [facility], [place] [address], [place], [place] [address]      Labs: BNP (last 3 results) No results for input(s): BNP in the last 8760 hours. Basic Metabolic Panel: Recent Labs  Lab 12/11/18 1734 12/12/18 0421  NA 143 142  K 4.3 3.5  CL 103 108  CO2 29 25  GLUCOSE 133* 125*  BUN 11 9  CREATININE 0.91 0.80  CALCIUM 9.3 8.3*   Liver Function Tests: Recent Labs  Lab 12/11/18 1734 12/12/18 0421  AST 22 14*  ALT 28 22  ALKPHOS 41 33*  BILITOT 0.8 0.9  PROT 8.2* 6.3*  ALBUMIN 4.7 3.6   Recent Labs  Lab 12/11/18 1734  LIPASE 34   No results for input(s): AMMONIA in the last 168 hours. CBC: Recent Labs    Lab 12/11/18 1734 12/12/18 0421  WBC 16.1* 10.9*  NEUTROABS  --  8.0*  HGB 16.5 13.7  HCT 50.5 42.5  MCV 95.8 97.0  PLT 300 219   Cardiac Enzymes: No results for input(s): CKTOTAL, CKMB, CKMBINDEX, TROPONINI in the last 168 hours. BNP: Invalid input(s): POCBNP CBG: No results for input(s): GLUCAP in the last 168 hours. D-Dimer No results for input(s): DDIMER in the last 72 hours. Hgb A1c No results for input(s): HGBA1C in the last 72 hours. Lipid Profile No results for input(s): CHOL, HDL, LDLCALC, TRIG, CHOLHDL, LDLDIRECT in the last 72 hours. Thyroid function studies No results for input(s): TSH, T4TOTAL, T3FREE, THYROIDAB in the last 72 hours.  Invalid input(s): FREET3 Anemia work up No results for input(s): VITAMINB12, FOLATE, FERRITIN, TIBC, IRON, RETICCTPCT in the last 72 hours. Urinalysis    Component Value Date/Time   COLORURINE YELLOW 12/11/2018 1734   APPEARANCEUR CLEAR 12/11/2018 1734   LABSPEC >1.046 (H) 12/11/2018 1734   PHURINE 6.0 12/11/2018 1734   GLUCOSEU NEGATIVE 12/11/2018 1734   HGBUR NEGATIVE 12/11/2018 1734   BILIRUBINUR NEGATIVE 12/11/2018 1734   KETONESUR NEGATIVE 12/11/2018 1734   PROTEINUR NEGATIVE 12/11/2018 1734   NITRITE NEGATIVE 12/11/2018 1734   LEUKOCYTESUR NEGATIVE 12/11/2018 1734   Sepsis Labs Invalid input(s): PROCALCITONIN,  WBC,  LACTICIDVEN   Time coordinating discharge: 25 minutes  SIGNED:  Mercy Riding, MD  [facility] 12/12/2018, 2:14 PM  If 7PM-7AM, please contact night-coverage [email or web address] Password [email or web address]

## 2018-12-13 ENCOUNTER — Telehealth: Payer: Self-pay

## 2018-12-13 NOTE — Telephone Encounter (Signed)
Patient will call me back. Need to complete TCM call and schedule appointment.

## 2018-12-14 NOTE — Telephone Encounter (Signed)
Please advise Dr Diona Browner if you wish to proceed with seeing this patient as scheduled.   It was discovered at the end of the TCM oafter scheduling the patient that he had not been seen since 2011. Pt has not been seen my another PCP over the years. Pt states that he just never followed up as he was supposed to - "hasnt had anything going on".  Technically we are not supposed to do a TCM on a patient that is not established... Do you want to proceed with seeing this patient? Re-establish to care / TCM follow up?

## 2018-12-14 NOTE — Telephone Encounter (Addendum)
Transition Care Management Follow-up Telephone Call   Date discharged?  Admit date: 12/11/2018  Discharge date: 12/12/2018   Admitted From: Home  Disposition: Home   Recommendations for Outpatient Follow-up:  1. Follow up with PCP in 1-2 weeks 2. Please obtain CBC/BMP/Mag at follow up 3. Please follow up on the following pending results: None   How have you been since you were released from the hospital? "I have been doing well."   Do you understand why you were in the hospital? yes   Do you understand the discharge instructions? yes   Where were you discharged to? home   Items Reviewed:  Medications reviewed: yes  Allergies reviewed: yes  Dietary changes reviewed: yes  Referrals reviewed: yes   Functional Questionnaire:   Activities of Daily Living (ADLs):   He states they are independent in the following: ambulation, bathing and hygiene, feeding, continence, grooming, toileting and dressing States they require assistance with the following: n/a   Any transportation issues/concerns?: no   Any patient concerns? no   Confirmed importance and date/time of follow-up visits scheduled yes  Provider Appointment booked with Dr Diona Browner 12/21/2018 at 8am  Confirmed with patient if condition begins to worsen call PCP or go to the ER.  Patient was given the office number and encouraged to call back with question or concerns.  : yes

## 2018-12-15 NOTE — Telephone Encounter (Signed)
Yes okay to keep appt as scheduled.    F/up SBO

## 2018-12-15 NOTE — Telephone Encounter (Signed)
Dr. Diona Browner,  Patient is scheduled for a 25min hospital follow up with you next Friday.  He has not been seen since 2011.  He states he still considers you his PCP, he has just not had any need to be seen since.    Are you okay to keep him on your scheduled for hospital follow up at 820 next Friday?  If so, should we consider rescheduling him to a different slot to allow you more time as he would be considered a new (re-establish care) patient?  Just want to be sure we schedule him appropriately.   Thanks.

## 2018-12-18 NOTE — Telephone Encounter (Signed)
Patient is okay to remain scheduled as is.  Ashtyn, is there anything further needed for this patient?

## 2018-12-21 ENCOUNTER — Inpatient Hospital Stay: Payer: 59 | Admitting: Family Medicine

## 2018-12-22 ENCOUNTER — Other Ambulatory Visit: Payer: Self-pay

## 2018-12-22 ENCOUNTER — Encounter: Payer: Self-pay | Admitting: Family Medicine

## 2018-12-22 ENCOUNTER — Ambulatory Visit: Payer: 59 | Admitting: Family Medicine

## 2018-12-22 VITALS — BP 100/60 | HR 72 | Temp 98.6°F | Ht 68.5 in | Wt 172.2 lb

## 2018-12-22 DIAGNOSIS — K56600 Partial intestinal obstruction, unspecified as to cause: Secondary | ICD-10-CM

## 2018-12-22 DIAGNOSIS — Z130 Encounter for screening for diseases of the blood and blood-forming organs and certain disorders involving the immune mechanism: Secondary | ICD-10-CM

## 2018-12-22 DIAGNOSIS — Z13220 Encounter for screening for lipoid disorders: Secondary | ICD-10-CM

## 2018-12-22 DIAGNOSIS — Z23 Encounter for immunization: Secondary | ICD-10-CM

## 2018-12-22 NOTE — Assessment & Plan Note (Signed)
Resolved and pt back on regular diet.

## 2018-12-22 NOTE — Progress Notes (Signed)
Chief Complaint  Patient presents with  . Hospitalization Follow-up    History of Present Illness: HPI  58 year old male presents for hospital follow up.  Admitted on 12/11/2018 to 8/11 for SBO. Hospital Course: 58 year old male with history of BPH and environmental allergy presented with nausea, vomiting and occasional diarrhea and found to have mild partial SBO on CT abdomen and pelvis.  Reportedly started having nausea and emesis about 3 AM on the day of presentation.  However, he had bowel movement about 12:30 PM the same day.  He has history of appendectomy and hernia repairs.  Vital signs stable.  CBC and CMP not impressive.  UA concerning for dehydration.  Had repeat KUB the next morning with improvement in his SBO with gas pattern throughout.  Evaluated by general surgery who cleared him for discharge. He was discharged on full liquid diet with an instruction to advance to soft diet, then regular diet as tolerated.    Since he has been home.. no further emesisis, no fever, no abd pain.  Nml BMs. Drinking lots of water.  Needs cbc, BMP, Mg at follow up.  He has had some leg spasms. Doing well overall.  COVID 19 screen No recent travel or known exposure to COVID19 The patient denies respiratory symptoms of COVID 19 at this time.  The importance of social distancing was discussed today.   Review of Systems  Constitutional: Negative for chills and fever.  HENT: Negative for congestion and ear pain.   Eyes: Negative for pain and redness.  Respiratory: Negative for cough and shortness of breath.   Cardiovascular: Negative for chest pain, palpitations and leg swelling.  Gastrointestinal: Negative for abdominal pain, blood in stool, constipation, diarrhea, nausea and vomiting.  Genitourinary: Negative for dysuria.  Musculoskeletal: Negative for falls and myalgias.  Skin: Negative for rash.  Neurological: Negative for dizziness.  Psychiatric/Behavioral: Negative for depression.  The patient is not nervous/anxious.       Past Medical History:  Diagnosis Date  . Alcoholism in family   . Allergic rhinitis, cause unspecified   . Unspecified hyperplasia of prostate without urinary obstruction and other lower urinary tract symptoms (LUTS)     reports that he quit smoking about 17 years ago. He quit after 20.00 years of use. He has never used smokeless tobacco. He reports current alcohol use. He reports that he does not use drugs.   Current Outpatient Medications:  .  loratadine (CLARITIN) 10 MG tablet, Take 10 mg by mouth daily., Disp: , Rfl:  .  tamsulosin (FLOMAX) 0.4 MG CAPS capsule, Take 0.8 mg by mouth every evening., Disp: , Rfl:  .  XYZAL ALLERGY 24HR 5 MG tablet, TAKE 1 TABLET (5 MG TOTAL) BY MOUTH EVERY EVENING., Disp: 30 tablet, Rfl: 3   Observations/Objective: Blood pressure 100/60, pulse 72, temperature 98.6 F (37 C), temperature source Temporal, height 5' 8.5" (1.74 m), weight 172 lb 4 oz (78.1 kg), SpO2 98 %.  Physical Exam Constitutional:      Appearance: He is well-developed.  HENT:     Head: Normocephalic.     Right Ear: Hearing normal.     Left Ear: Hearing normal.     Nose: Nose normal.  Neck:     Thyroid: No thyroid mass or thyromegaly.     Vascular: No carotid bruit.     Trachea: Trachea normal.  Cardiovascular:     Rate and Rhythm: Normal rate and regular rhythm.     Pulses: Normal pulses.  Heart sounds: Heart sounds not distant. No murmur. No friction rub. No gallop.      Comments: No peripheral edema Pulmonary:     Effort: Pulmonary effort is normal. No respiratory distress.     Breath sounds: Normal breath sounds.  Skin:    General: Skin is warm and dry.     Findings: No rash.  Psychiatric:        Speech: Speech normal.        Behavior: Behavior normal.        Thought Content: Thought content normal.      Assessment and Plan   Partial small bowel obstruction (HCC) Resolved and pt back on regular diet.     Eliezer Lofts, MD

## 2019-01-12 ENCOUNTER — Other Ambulatory Visit: Payer: Self-pay

## 2019-01-12 ENCOUNTER — Ambulatory Visit (INDEPENDENT_AMBULATORY_CARE_PROVIDER_SITE_OTHER): Payer: 59 | Admitting: Family Medicine

## 2019-01-12 ENCOUNTER — Encounter: Payer: Self-pay | Admitting: Family Medicine

## 2019-01-12 VITALS — BP 100/62 | HR 66 | Temp 98.7°F | Ht 68.5 in | Wt 173.5 lb

## 2019-01-12 DIAGNOSIS — Z0000 Encounter for general adult medical examination without abnormal findings: Secondary | ICD-10-CM

## 2019-01-12 DIAGNOSIS — Z13220 Encounter for screening for lipoid disorders: Secondary | ICD-10-CM | POA: Diagnosis not present

## 2019-01-12 DIAGNOSIS — Z8719 Personal history of other diseases of the digestive system: Secondary | ICD-10-CM

## 2019-01-12 DIAGNOSIS — N400 Enlarged prostate without lower urinary tract symptoms: Secondary | ICD-10-CM

## 2019-01-12 DIAGNOSIS — J309 Allergic rhinitis, unspecified: Secondary | ICD-10-CM | POA: Diagnosis not present

## 2019-01-12 DIAGNOSIS — Z130 Encounter for screening for diseases of the blood and blood-forming organs and certain disorders involving the immune mechanism: Secondary | ICD-10-CM | POA: Diagnosis not present

## 2019-01-12 DIAGNOSIS — Z23 Encounter for immunization: Secondary | ICD-10-CM

## 2019-01-12 LAB — COMPREHENSIVE METABOLIC PANEL
ALT: 17 U/L (ref 0–53)
AST: 16 U/L (ref 0–37)
Albumin: 4.3 g/dL (ref 3.5–5.2)
Alkaline Phosphatase: 33 U/L — ABNORMAL LOW (ref 39–117)
BUN: 16 mg/dL (ref 6–23)
CO2: 31 mEq/L (ref 19–32)
Calcium: 9.3 mg/dL (ref 8.4–10.5)
Chloride: 102 mEq/L (ref 96–112)
Creatinine, Ser: 0.98 mg/dL (ref 0.40–1.50)
GFR: 77.88 mL/min (ref >60.00)
Glucose, Bld: 100 mg/dL — ABNORMAL HIGH (ref 70–99)
Potassium: 4.5 mEq/L (ref 3.5–5.1)
Sodium: 139 mEq/L (ref 135–145)
Total Bilirubin: 0.6 mg/dL (ref 0.2–1.2)
Total Protein: 6.7 g/dL (ref 6.0–8.3)

## 2019-01-12 LAB — CBC WITH DIFFERENTIAL/PLATELET
Basophils Absolute: 0 10*3/uL (ref 0.0–0.1)
Basophils Relative: 0.8 % (ref 0.0–3.0)
Eosinophils Absolute: 0.2 10*3/uL (ref 0.0–0.7)
Eosinophils Relative: 3.7 % (ref 0.0–5.0)
HCT: 42.3 % (ref 39.0–52.0)
Hemoglobin: 14.4 g/dL (ref 13.0–17.0)
Lymphocytes Relative: 35.5 % (ref 12.0–46.0)
Lymphs Abs: 1.5 10*3/uL (ref 0.7–4.0)
MCHC: 34 g/dL (ref 30.0–36.0)
MCV: 93.5 fl (ref 78.0–100.0)
Monocytes Absolute: 0.4 10*3/uL (ref 0.1–1.0)
Monocytes Relative: 9.3 % (ref 3.0–12.0)
Neutro Abs: 2.1 10*3/uL (ref 1.4–7.7)
Neutrophils Relative %: 50.7 % (ref 43.0–77.0)
Platelets: 234 10*3/uL (ref 150.0–400.0)
RBC: 4.53 Mil/uL (ref 4.22–5.81)
RDW: 13.4 % (ref 11.5–15.5)
WBC: 4.2 10*3/uL (ref 4.0–10.5)

## 2019-01-12 LAB — LIPID PANEL
Cholesterol: 152 mg/dL (ref 0–200)
HDL: 57.4 mg/dL (ref >39.00)
LDL Cholesterol: 86 mg/dL (ref 0–99)
NonHDL: 94.72
Total CHOL/HDL Ratio: 3
Triglycerides: 44 mg/dL (ref 0.0–149.0)
VLDL: 8.8 mg/dL (ref 0.0–40.0)

## 2019-01-12 LAB — MAGNESIUM: Magnesium: 1.9 mg/dL (ref 1.5–2.5)

## 2019-01-12 NOTE — Progress Notes (Signed)
Chief Complaint  Patient presents with  . Annual Exam    History of Present Illness: HPI   The patient is here for annual wellness exam and preventative care.    12/11/2018 Admitted for partial small bowel obstruction  Body mass index is 26 kg/m.  Wt Readings from Last 3 Encounters:  01/12/19 173 lb 8 oz (78.7 kg)  12/22/18 172 lb 4 oz (78.1 kg)  12/11/18 171 lb 11.8 oz (77.9 kg)   Diet: healthy  Exercise: walking 8 miles daily   BPH: well controlled on flomax  Allergic rhinitis: Stable on claritin or Xyzal.     [facility] from 12/22/2018 [facility]  PHQ-2 Total Score  0       COVID 19 screen No recent travel or known exposure to COVID19 The patient denies respiratory symptoms of COVID 19 at this time.  The importance of social distancing was discussed today.   Review of Systems  Constitutional: Negative for chills and fever.  HENT: Negative for congestion and ear pain.   Eyes: Negative for pain and redness.  Respiratory: Negative for cough and shortness of breath.   Cardiovascular: Negative for chest pain, palpitations and leg swelling.  Gastrointestinal: Negative for abdominal pain, blood in stool, constipation, diarrhea, nausea and vomiting.  Genitourinary: Negative for dysuria.  Musculoskeletal: Negative for falls and myalgias.  Skin: Negative for rash.  Neurological: Negative for dizziness.  Psychiatric/Behavioral: Negative for depression. The patient is not nervous/anxious.       Past Medical History:  Diagnosis Date  . Alcoholism in family   . Allergic rhinitis, cause unspecified   . Unspecified hyperplasia of prostate without urinary obstruction and other lower urinary tract symptoms (LUTS)     reports that he quit smoking about 17 years ago. He quit after 20.00 years of use. He has never used smokeless tobacco. He reports current alcohol use. He reports that he does not use drugs.   Current Outpatient Medications:  .   loratadine (CLARITIN) 10 MG tablet, Take 10 mg by mouth daily., Disp: , Rfl:  .  tamsulosin (FLOMAX) 0.4 MG CAPS capsule, Take 0.8 mg by mouth every evening., Disp: , Rfl:  .  XYZAL ALLERGY 24HR 5 MG tablet, TAKE 1 TABLET (5 MG TOTAL) BY MOUTH EVERY EVENING., Disp: 30 tablet, Rfl: 3   Observations/Objective: Pulse 66, temperature 98.7 F (37.1 C), temperature source Temporal, height 5' 8.5" (1.74 m), weight 173 lb 8 oz (78.7 kg), SpO2 97 %. BP Readings from Last 3 Encounters:  01/12/19 100/62  12/22/18 100/60  12/12/18 111/75    Physical Exam Constitutional:      General: He is not in acute distress.    Appearance: Normal appearance. He is well-developed. He is not ill-appearing or toxic-appearing.  HENT:     Head: Normocephalic and atraumatic.     Right Ear: Hearing, tympanic membrane, ear canal and external ear normal.     Left Ear: Hearing, tympanic membrane, ear canal and external ear normal.     Nose: Nose normal.     Mouth/Throat:     Pharynx: Uvula midline.  Eyes:     General: Lids are normal. Lids are everted, no foreign bodies appreciated.     Conjunctiva/sclera: Conjunctivae normal.     Pupils: Pupils are equal, round, and reactive to light.  Neck:     Musculoskeletal: Normal range of motion and neck supple.     Thyroid: No thyroid mass or thyromegaly.  Vascular: No carotid bruit.     Trachea: Trachea and phonation normal.  Cardiovascular:     Rate and Rhythm: Normal rate and regular rhythm.     Pulses: Normal pulses.     Heart sounds: S1 normal and S2 normal. No murmur. No gallop.   Pulmonary:     Breath sounds: Normal breath sounds. No wheezing, rhonchi or rales.  Abdominal:     General: Bowel sounds are normal.     Palpations: Abdomen is soft.     Tenderness: There is no abdominal tenderness. There is no guarding or rebound.     Hernia: No hernia is present.  Lymphadenopathy:     Cervical: No cervical adenopathy.  Skin:    General: Skin is warm and dry.      Findings: No rash.  Neurological:     Mental Status: He is alert.     Cranial Nerves: No cranial nerve deficit.     Sensory: No sensory deficit.     Gait: Gait normal.     Deep Tendon Reflexes: Reflexes are normal and symmetric.  Psychiatric:        Speech: Speech normal.        Behavior: Behavior normal.        Judgment: Judgment normal.      Assessment and Plan The patient's preventative maintenance and recommended screening tests for an annual wellness exam were reviewed in full today. Brought up to date unless services declined.  Counselled on the importance of diet, exercise, and its role in overall health and mortality. The patient's FH and SH was reviewed, including their home life, tobacco status, and drug and alcohol status.   Vaccines:  GivenTdap, uptodate with flu  PSA per [facility] Lab Results  Component Value Date   PSA 3.69 05/16/2015   PSA 2.63 05/30/2009  Colonoscopy: 2011 polyps  Dr. Deatra Ina, rec repeat in 3 years. Hep C and HIV: neg in past  Former smoker 20 pack year history quit 14 years ago.  ETOH: once yearly  No drug use.  BPH (benign prostatic hyperplasia) Followed by [facility]. Good control on flomax.  History of small bowel obstruction No further issues  Allergic rhinitis Well controlled. Continue current medication.     Eliezer Lofts, MD

## 2019-01-12 NOTE — Addendum Note (Signed)
Addended by: Cloyd Stagers on: 01/12/2019 09:16 AM   Modules accepted: Orders

## 2019-01-12 NOTE — Assessment & Plan Note (Signed)
Well controlled. Continue current medication.  

## 2019-01-12 NOTE — Addendum Note (Signed)
Addended by: Carter Kitten on: 01/12/2019 08:55 AM   Modules accepted: Orders

## 2019-01-12 NOTE — Assessment & Plan Note (Signed)
Followed by [facility]. Good control on flomax.

## 2019-01-12 NOTE — Patient Instructions (Addendum)
Please stop at the lab to have labs drawn. Call to set up colonoscopy ASAP... with Dr. Deatra Ina at [facility] [phone].

## 2019-01-12 NOTE — Assessment & Plan Note (Signed)
No further issues 

## 2019-01-15 ENCOUNTER — Encounter: Payer: Self-pay | Admitting: *Deleted

## 2019-09-13 ENCOUNTER — Telehealth: Payer: Self-pay | Admitting: *Deleted

## 2019-09-13 ENCOUNTER — Ambulatory Visit: Payer: 59

## 2019-09-13 ENCOUNTER — Other Ambulatory Visit: Payer: Self-pay

## 2019-09-13 NOTE — Telephone Encounter (Signed)
Pt here for PV- recall colonoscopy.  Pt has not received covid vaccine and wants to wait till August to get vaccine.  I told him that he would have to get a pre-procedure covid test and he states he doesn't want to do that.  He is not having any GI issues and would like to wait till August to have his colonoscopy.  Colonoscopy and PV cancelled for now.  Recall put in for 12-02-19- [id number].

## 2019-09-20 ENCOUNTER — Encounter: Payer: 59 | Admitting: Gastroenterology

## 2019-09-25 IMAGING — CT CT ABDOMEN AND PELVIS WITH CONTRAST
2 of 5 series · 15 of 46 positions shown, 17 images · IV contrast (omnipaque)
Comparison: None.

CLINICAL DATA: Abdominal pain gastroenteritis or colitis

EXAM:
CT ABDOMEN AND PELVIS WITH CONTRAST
TECHNIQUE: Multidetector CT imaging of the abdomen and pelvis was performed
using the standard protocol following bolus administration of
intravenous contrast.
CONTRAST:  100mL OMNIPAQUE IOHEXOL 300 MG/ML  SOLN

[Series 2: axial st · axial · 0.79mm/px · z∈[-433,-53]mm · 12 of 88 slices shown, 14 images]
[im 6/88  soft-tissue]
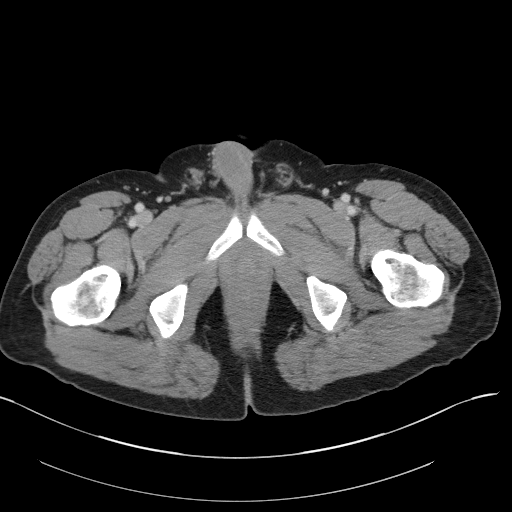
[im 6/88  bone]
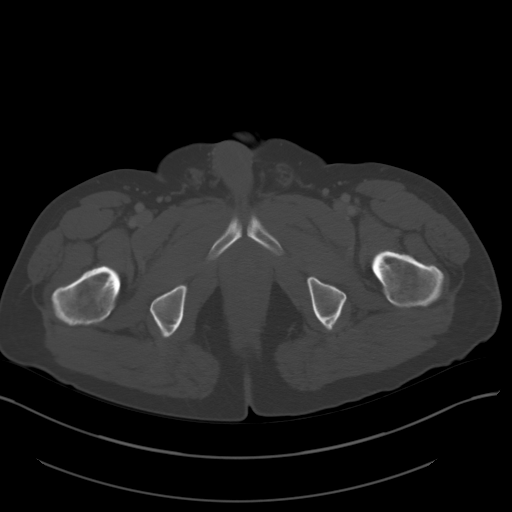
[im 12/88  soft-tissue]
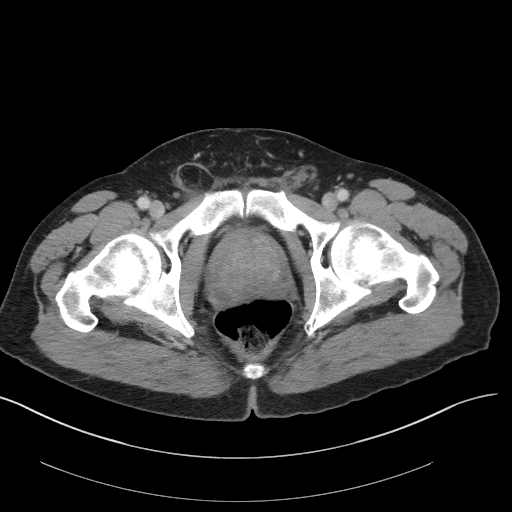
[im 18/88  soft-tissue]
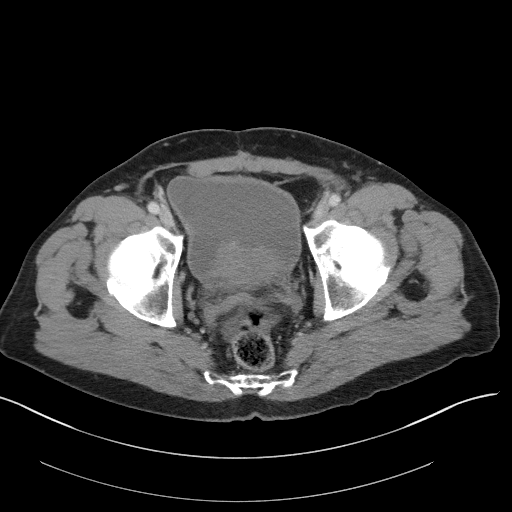
[im 30/88  soft-tissue]
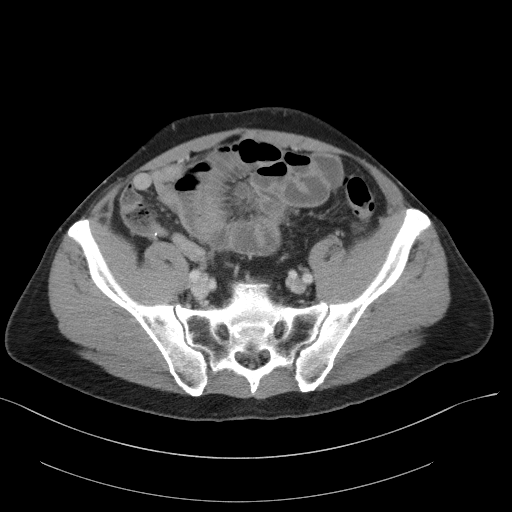
[im 35/88  soft-tissue]
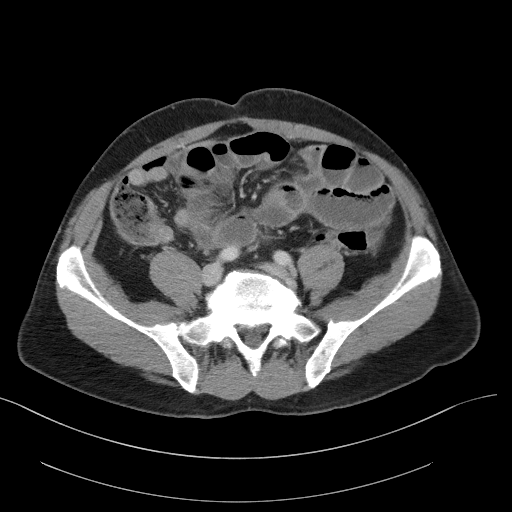
[im 41/88  soft-tissue]
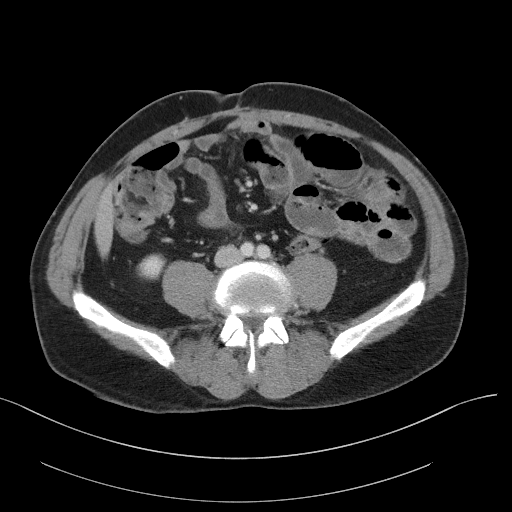
[im 47/88  soft-tissue]
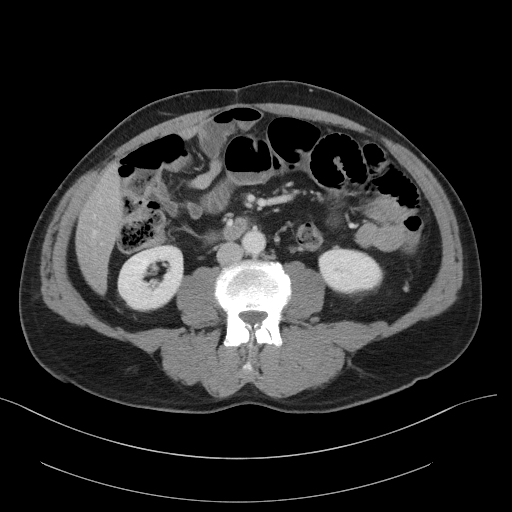
[im 53/88  soft-tissue]
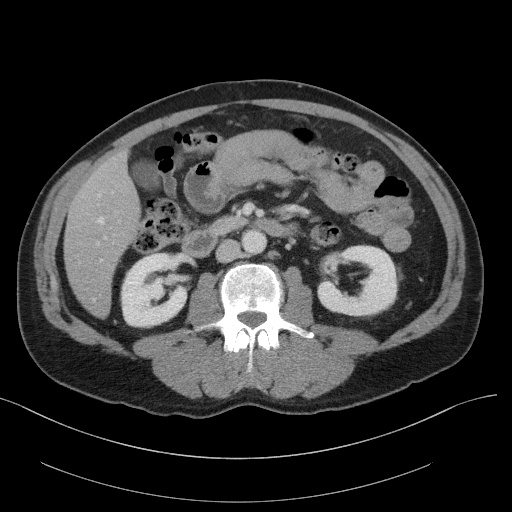
[im 59/88  soft-tissue]
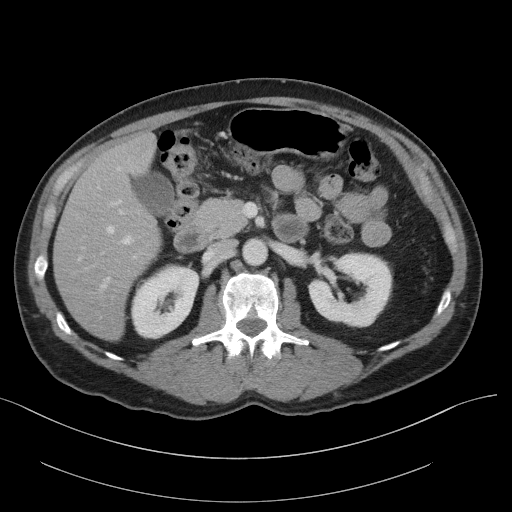
[im 59/88  bone]
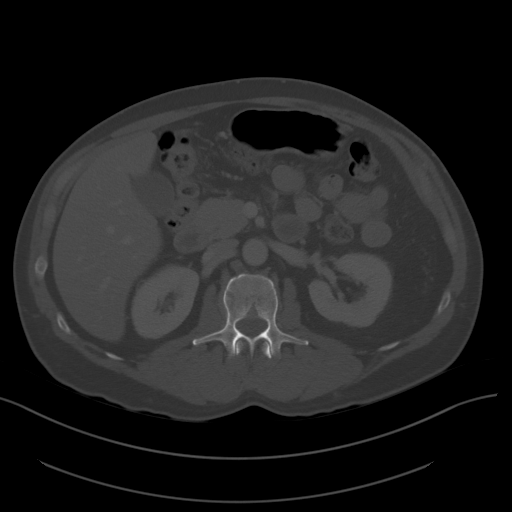
[im 70/88  soft-tissue]
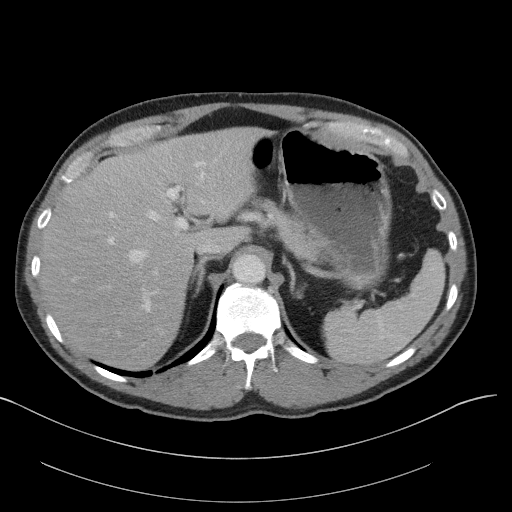
[im 76/88  soft-tissue]
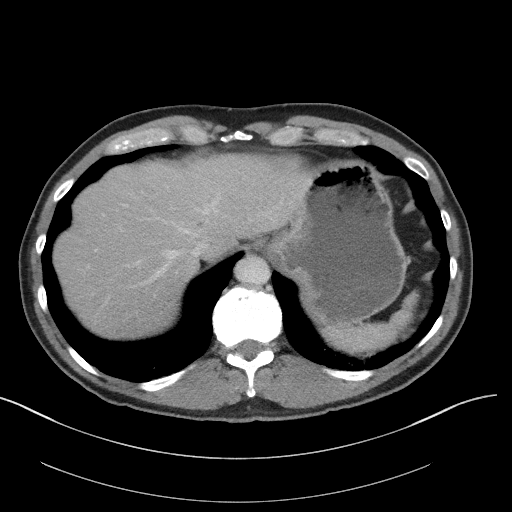
[im 82/88  soft-tissue]
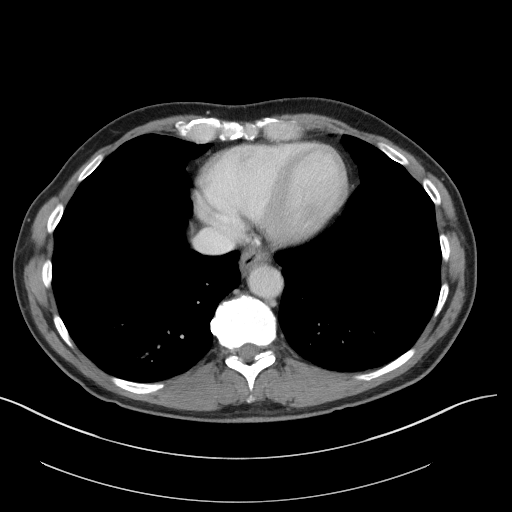

[Series 5: coronal st · coronal · 0.66mm/px · 3 of 77 slices shown]
[im 26/77  soft-tissue]
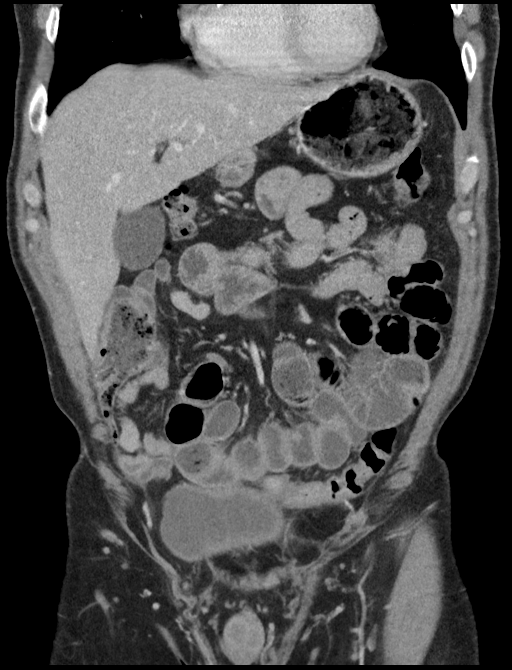
[im 34/77  soft-tissue]
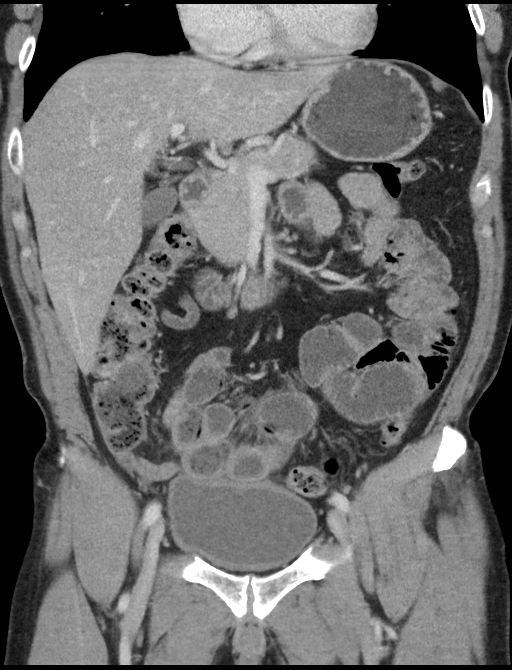
[im 43/77  soft-tissue]
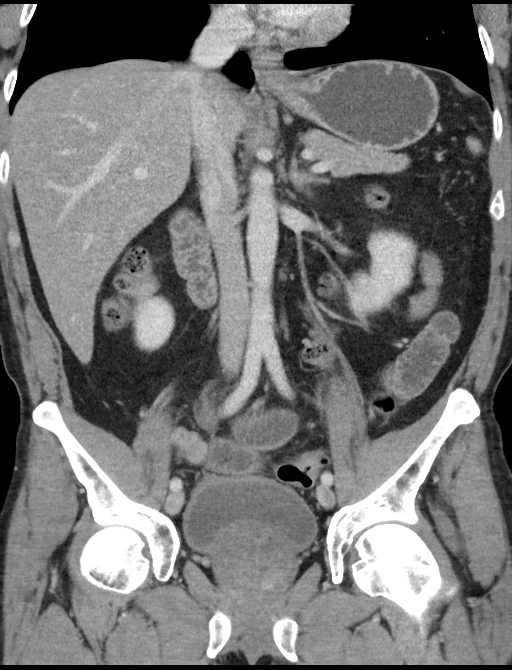

[15 of 46 positions shown; findings below may reference images not displayed]

FINDINGS: Lower chest: The visualized heart size within normal limits. No
pericardial fluid/thickening.

No hiatal hernia.

Tiny bullous change seen at the right lung base.

Hepatobiliary: There is an 8 mm septated low-density lesion within
the right liver lobe.The main portal vein is patent. No evidence of
calcified gallstones, gallbladder wall thickening or biliary
dilatation.

Pancreas: Unremarkable. No pancreatic ductal dilatation or
surrounding inflammatory changes.

Spleen: Normal in size without focal abnormality.

Adrenals/Urinary Tract: Both adrenal glands appear normal. The
kidneys and collecting system appear normal without evidence of
urinary tract calculus or hydronephrosis. Bladder is unremarkable.

Stomach/Bowel: The stomach is normal in appearance. There is mildly
dilated loops of jejunum and proximal to mid ileum measuring up to 3
cm in length. There is fecalization of the mid to distal ileal loops
best seen on series 2, image 62. There appears to be a gradual area
of narrowing seen within the right lower quadrant best seen on
series 2, image 47. The remainder of the distal ileal loops are
decompressed. Surgical sutures seen from prior appendectomy. There
is air and stool seen within the right colon. This extends to the
level of the rectum without focal area of bowel wall thickening or
inflammatory changes.

Vascular/Lymphatic: There are no enlarged mesenteric,
retroperitoneal, or pelvic lymph nodes. No significant vascular
findings are present.

Reproductive: The prostate appears to be heterogeneous and measures
6 cm and transverse dimension.

Other: No evidence of abdominal wall mass or hernia.

Musculoskeletal: No acute or significant osseous findings.
IMPRESSION: 1. Partial small bowel obstruction with a gradual area of narrowing
seen within the distal ileal loops as described above. This could be
due to adhesions or stricture. No definite evidence of complete
obstruction or focal bowel wall thickening.
2. Status post appendectomy

## 2019-10-05 ENCOUNTER — Encounter: Payer: 59 | Admitting: Gastroenterology

## 2022-06-24 ENCOUNTER — Ambulatory Visit (AMBULATORY_SURGERY_CENTER): Payer: 59

## 2022-06-24 VITALS — Ht 68.5 in | Wt 165.0 lb

## 2022-06-24 DIAGNOSIS — Z1211 Encounter for screening for malignant neoplasm of colon: Secondary | ICD-10-CM

## 2022-06-24 MED ORDER — NA SULFATE-K SULFATE-MG SULF 17.5-3.13-1.6 GM/177ML PO SOLN: 1.0000 | Freq: Once | ORAL | 0 refills | Status: AC

## 2022-06-24 NOTE — Progress Notes (Signed)

## 2022-07-02 ENCOUNTER — Encounter: Payer: Self-pay | Admitting: Gastroenterology

## 2022-07-15 ENCOUNTER — Ambulatory Visit (INDEPENDENT_AMBULATORY_CARE_PROVIDER_SITE_OTHER): Payer: 59 | Admitting: Family Medicine

## 2022-07-15 ENCOUNTER — Encounter: Payer: Self-pay | Admitting: Family Medicine

## 2022-07-15 VITALS — BP 90/60 | HR 79 | Temp 97.7°F | Ht 68.5 in | Wt 161.0 lb

## 2022-07-15 DIAGNOSIS — Z0000 Encounter for general adult medical examination without abnormal findings: Secondary | ICD-10-CM | POA: Diagnosis not present

## 2022-07-15 DIAGNOSIS — Z13220 Encounter for screening for lipoid disorders: Secondary | ICD-10-CM | POA: Diagnosis not present

## 2022-07-15 LAB — LIPID PANEL
Cholesterol: 163 mg/dL (ref 0–200)
HDL: 68.9 mg/dL (ref >39.00)
LDL Cholesterol: 83 mg/dL (ref 0–99)
NonHDL: 93.89
Total CHOL/HDL Ratio: 2
Triglycerides: 56 mg/dL (ref 0.0–149.0)
VLDL: 11.2 mg/dL (ref 0.0–40.0)

## 2022-07-15 LAB — COMPREHENSIVE METABOLIC PANEL
ALT: 13 U/L (ref 0–53)
AST: 14 U/L (ref 0–37)
Albumin: 4 g/dL (ref 3.5–5.2)
Alkaline Phosphatase: 32 U/L — ABNORMAL LOW (ref 39–117)
BUN: 15 mg/dL (ref 6–23)
CO2: 30 mEq/L (ref 19–32)
Calcium: 8.9 mg/dL (ref 8.4–10.5)
Chloride: 103 mEq/L (ref 96–112)
Creatinine, Ser: 0.88 mg/dL (ref 0.40–1.50)
GFR: 91.18 mL/min (ref >60.00)
Glucose, Bld: 104 mg/dL — ABNORMAL HIGH (ref 70–99)
Potassium: 3.9 mEq/L (ref 3.5–5.1)
Sodium: 139 mEq/L (ref 135–145)
Total Bilirubin: 0.6 mg/dL (ref 0.2–1.2)
Total Protein: 6.7 g/dL (ref 6.0–8.3)

## 2022-07-15 NOTE — Progress Notes (Signed)
Blood pressure 90/60, pulse 79, temperature 97.7 F (36.5 C), temperature source Temporal, height 5' 8.5" (1.74 m), weight 161 lb (73 kg), SpO2 96 %.  Chief Complaint  Patient presents with   Annual Exam    History of Present Illness: HPI   The patient presents for annual medicare wellness, complete physical and review of chronic health problems. He/She also has the following acute concerns today:   The patient is here for annual wellness exam and preventative care.    12/11/2018 Admitted for partial small bowel obstruction  Body mass index is 24.12 kg/m.  BP Readings from Last 3 Encounters:  07/15/22 90/60  01/12/19 100/62  12/22/18 100/60     Wt Readings from Last 3 Encounters:  07/15/22 161 lb (73 kg)  06/24/22 165 lb (74.8 kg)  09/13/19 176 lb (79.8 kg)   Diet: healthy Exercise: walking 8 miles daily, mainly at work   BPH: well controlled on flomax  Followed by Urology  Dr. Eulogio Ditch.Marland Kitchen PSA stable.  Allergic rhinitis: Stable on claritin or Xyzal.   [place] Office Visit from 07/15/2022 in [place] at [facility] Total Score 0        COVID 19 screen No recent travel or known exposure to COVID19 The patient denies respiratory symptoms of COVID 19 at this time.  The importance of social distancing was discussed today.   Review of Systems  Constitutional:  Negative for chills and fever.  HENT:  Negative for congestion and ear pain.   Eyes:  Negative for pain and redness.  Respiratory:  Negative for cough and shortness of breath.   Cardiovascular:  Negative for chest pain, palpitations and leg swelling.  Gastrointestinal:  Negative for abdominal pain, blood in stool, constipation, diarrhea, nausea and vomiting.  Genitourinary:  Negative for dysuria.  Musculoskeletal:  Negative for falls and myalgias.  Skin:  Negative for rash.  Neurological:  Negative for dizziness.  Psychiatric/Behavioral:  Negative for depression. The  patient is not nervous/anxious.       Past Medical History:  Diagnosis Date   Alcoholism in family    Allergic rhinitis, cause unspecified    Unspecified hyperplasia of prostate without urinary obstruction and other lower urinary tract symptoms (LUTS)     reports that he quit smoking about 21 years ago. His smoking use included cigarettes. He has never used smokeless tobacco. He reports current alcohol use. He reports that he does not use drugs.   Current Outpatient Medications:    finasteride (PROSCAR) 5 MG tablet, Take 5 mg by mouth daily., Disp: , Rfl:    loratadine (CLARITIN) 10 MG tablet, Take 10 mg by mouth daily., Disp: , Rfl:    tamsulosin (FLOMAX) 0.4 MG CAPS capsule, Take 0.8 mg by mouth every evening., Disp: , Rfl:    Observations/Objective: Pulse 66, temperature 98.7 F (37.1 C), temperature source Temporal, height 5' 8.5" (1.74 m), weight 173 lb 8 oz (78.7 kg), SpO2 97 %. BP Readings from Last 3 Encounters:  07/15/22 90/60  01/12/19 100/62  12/22/18 100/60    Physical Exam Constitutional:      General: He is not in acute distress.    Appearance: Normal appearance. He is well-developed. He is not ill-appearing or toxic-appearing.  HENT:     Head: Normocephalic and atraumatic.     Right Ear: Hearing, tympanic membrane, ear canal and external ear normal.     Left Ear: Hearing, tympanic membrane, ear canal and external ear normal.  Nose: Nose normal.     Mouth/Throat:     Pharynx: Uvula midline.  Eyes:     General: Lids are normal. Lids are everted, no foreign bodies appreciated.     Conjunctiva/sclera: Conjunctivae normal.     Pupils: Pupils are equal, round, and reactive to light.  Neck:     Thyroid: No thyroid mass or thyromegaly.     Vascular: No carotid bruit.     Trachea: Trachea and phonation normal.  Cardiovascular:     Rate and Rhythm: Normal rate and regular rhythm.     Pulses: Normal pulses.     Heart sounds: S1 normal and S2 normal. No murmur  heard.    No gallop.  Pulmonary:     Breath sounds: Normal breath sounds. No wheezing, rhonchi or rales.  Abdominal:     General: Bowel sounds are normal.     Palpations: Abdomen is soft.     Tenderness: There is no abdominal tenderness. There is no guarding or rebound.     Hernia: No hernia is present.  Musculoskeletal:     Cervical back: Normal range of motion and neck supple.  Lymphadenopathy:     Cervical: No cervical adenopathy.  Skin:    General: Skin is warm and dry.     Findings: No rash.  Neurological:     Mental Status: He is alert.     Cranial Nerves: No cranial nerve deficit.     Sensory: No sensory deficit.     Gait: Gait normal.     Deep Tendon Reflexes: Reflexes are normal and symmetric.  Psychiatric:        Speech: Speech normal.        Behavior: Behavior normal.        Judgment: Judgment normal.      Assessment and Plan The patient's preventative maintenance and recommended screening tests for an annual wellness exam were reviewed in full today. Brought up to date unless services declined.  Counselled on the importance of diet, exercise, and its role in overall health and mortality. The patient's FH and SH was reviewed, including their home life, tobacco status, and drug and alcohol status.   Vaccines:  GivenTdap, uptodate with flu, consider Shingrix.  PSA per [facility] Dr. Eulogio Ditch Lab Results  Component Value Date   PSA 3.69 05/16/2015   PSA 2.63 05/30/2009  Colonoscopy: 2011 polyps  Dr. Deatra Ina, rec repeat in 3 years. Scheduled next week. Hep C and HIV: neg in past  Former smoker 20 pack year history quit >15 years ago, e: cigs daily with nictotine  ETOH: once yearly  No drug use.  Routine general medical examination at a health care facility  Screening cholesterol level -     Lipid panel -     Comprehensive metabolic panel       Eliezer Lofts, MD

## 2022-07-16 ENCOUNTER — Encounter: Payer: Self-pay | Admitting: *Deleted

## 2022-07-23 ENCOUNTER — Encounter: Payer: Self-pay | Admitting: Gastroenterology

## 2022-07-23 ENCOUNTER — Ambulatory Visit (AMBULATORY_SURGERY_CENTER): Payer: 59 | Admitting: Gastroenterology

## 2022-07-23 VITALS — BP 111/63 | HR 57 | Temp 97.8°F | Resp 12 | Ht 68.5 in | Wt 160.0 lb

## 2022-07-23 DIAGNOSIS — Z1211 Encounter for screening for malignant neoplasm of colon: Secondary | ICD-10-CM

## 2022-07-23 DIAGNOSIS — D122 Benign neoplasm of ascending colon: Secondary | ICD-10-CM | POA: Diagnosis not present

## 2022-07-23 DIAGNOSIS — D128 Benign neoplasm of rectum: Secondary | ICD-10-CM

## 2022-07-23 DIAGNOSIS — D123 Benign neoplasm of transverse colon: Secondary | ICD-10-CM

## 2022-07-23 MED ORDER — SODIUM CHLORIDE 0.9 % IV SOLN: 500.0000 mL | INTRAVENOUS | Status: DC

## 2022-07-23 NOTE — Progress Notes (Signed)
To pacu, VSS. Report to [email or web address] 

## 2022-07-23 NOTE — Progress Notes (Signed)
Called to room to assist during endoscopic procedure.  Patient ID and intended procedure confirmed with present staff. Received instructions for my participation in the procedure from the performing physician.  

## 2022-07-23 NOTE — Patient Instructions (Signed)
Handout on polyps and diverticulosis given.    YOU HAD AN ENDOSCOPIC PROCEDURE TODAY AT THE [facility]:   Refer to the procedure report that was given to you for any specific questions about what was found during the examination.  If the procedure report does not answer your questions, please call your gastroenterologist to clarify.  If you requested that your care partner not be given the details of your procedure findings, then the procedure report has been included in a sealed envelope for you to review at your convenience later.  YOU SHOULD EXPECT: Some feelings of bloating in the abdomen. Passage of more gas than usual.  Walking can help get rid of the air that was put into your GI tract during the procedure and reduce the bloating. If you had a lower endoscopy (such as a colonoscopy or flexible sigmoidoscopy) you may notice spotting of blood in your stool or on the toilet paper. If you underwent a bowel prep for your procedure, you may not have a normal bowel movement for a few days.  Please Note:  You might notice some irritation and congestion in your nose or some drainage.  This is from the oxygen used during your procedure.  There is no need for concern and it should clear up in a day or so.  SYMPTOMS TO REPORT IMMEDIATELY:  Following lower endoscopy (colonoscopy or flexible sigmoidoscopy):  Excessive amounts of blood in the stool  Significant tenderness or worsening of abdominal pains  Swelling of the abdomen that is new, acute  Fever of 100F or higher   For urgent or emergent issues, a gastroenterologist can be reached at any hour by calling [phone]. Do not use MyChart messaging for urgent concerns.    DIET:  We do recommend a small meal at first, but then you may proceed to your regular diet.  Drink plenty of fluids but you should avoid alcoholic beverages for 24 hours.  ACTIVITY:  You should plan to take it easy for the rest of today and you should NOT  DRIVE or use heavy machinery until tomorrow (because of the sedation medicines used during the test).    FOLLOW UP: Our staff will call the number listed on your records the next business day following your procedure.  We will call around 7:15- 8:00 am to check on you and address any questions or concerns that you may have regarding the information given to you following your procedure. If we do not reach you, we will leave a message.     If any biopsies were taken you will be contacted by phone or by letter within the next 1-3 weeks.  Please call us at [phone] if you have not heard about the biopsies in 3 weeks.    SIGNATURES/CONFIDENTIALITY: You and/or your care partner have signed paperwork which will be entered into your electronic medical record.  These signatures attest to the fact that that the information above on your After Visit Summary has been reviewed and is understood.  Full responsibility of the confidentiality of this discharge information lies with you and/or your care-partner. 

## 2022-07-23 NOTE — Progress Notes (Signed)
Vital signs checked by:AS  The patient states no changes in medical or surgical history since pre-visit screening on 06/24/2022

## 2022-07-23 NOTE — Progress Notes (Signed)
History and Physical:  This patient presents for endoscopic testing for: Encounter Diagnosis  Name Primary?   Special screening for malignant neoplasms, colon Yes    No adenomatous or SSP last colonoscopy 2011 Patient denies chronic abdominal pain, rectal bleeding, constipation or diarrhea.   Patient is otherwise without complaints or active issues today.   Past Medical History: Past Medical History:  Diagnosis Date   Alcoholism in family    Allergic rhinitis, cause unspecified    Unspecified hyperplasia of prostate without urinary obstruction and other lower urinary tract symptoms (LUTS)      Past Surgical History: Past Surgical History:  Procedure Laterality Date   APPENDECTOMY  2003   [place]   INGUINAL HERNIA REPAIR  2006    Allergies: No Known Allergies  Outpatient Meds: Current Outpatient Medications  Medication Sig Dispense Refill   finasteride (PROSCAR) 5 MG tablet Take 5 mg by mouth daily.     loratadine (CLARITIN) 10 MG tablet Take 10 mg by mouth daily.     tamsulosin (FLOMAX) 0.4 MG CAPS capsule Take 0.8 mg by mouth every evening.     Current Facility-Administered Medications  Medication Dose Route Frequency Provider Last Rate Last Admin   0.9 %  sodium chloride infusion  500 mL Intravenous Continuous Bedsole, Amy E, MD          ___________________________________________________________________ Objective   Exam:  BP 104/62   Pulse (!) 57   Temp 97.8 F (36.6 C)   Ht 5' 8.5" (1.74 m)   Wt 160 lb (72.6 kg)   SpO2 100%   BMI 23.97 kg/m   CV: regular , S1/S2 Resp: clear to auscultation bilaterally, normal RR and effort noted GI: soft, no tenderness, with active bowel sounds.   Assessment: Encounter Diagnosis  Name Primary?   Special screening for malignant neoplasms, colon Yes     Plan: Colonoscopy  The benefits and risks of the planned procedure were described in detail with the patient or (when appropriate)  their health care proxy.  Risks were outlined as including, but not limited to, bleeding, infection, perforation, adverse medication reaction leading to cardiac or pulmonary decompensation, pancreatitis (if ERCP).  The limitation of incomplete mucosal visualization was also discussed.  No guarantees or warranties were given.    The patient is appropriate for an endoscopic procedure in the ambulatory setting.   - Wilfrid Lund, MD

## 2022-07-23 NOTE — Op Note (Signed)
[facility] Patient Name: Chase Price Procedure Date: 07/23/2022 10:28 AM MRN: [id number] Endoscopist: Mallie Mussel L. Loletha Carrow , MD, [id number] Age: 62 Referring MD:  Date of Birth: 06-Nov-1960 Gender: Male Account #: [id number] Procedure:                Colonoscopy Indications:              Screening for colorectal malignant neoplasm                           No adenomatous or serrated polyps on last screening                            colonoscopy 2011 Medicines:                Monitored Anesthesia Care Procedure:                Pre-Anesthesia Assessment:                           - Prior to the procedure, a History and Physical                            was performed, and patient medications and                            allergies were reviewed. The patient's tolerance of                            previous anesthesia was also reviewed. The risks                            and benefits of the procedure and the sedation                            options and risks were discussed with the patient.                            All questions were answered, and informed consent                            was obtained. Prior Anticoagulants: The patient has                            taken no anticoagulant or antiplatelet agents. ASA                            Grade Assessment: II - A patient with mild systemic                            disease. After reviewing the risks and benefits,                            the patient was deemed in satisfactory condition to  undergo the procedure.                           After obtaining informed consent, the colonoscope                            was passed under direct vision. Throughout the                            procedure, the patient's blood pressure, pulse, and                            oxygen saturations were monitored continuously. The                            Olympus CF-HQ190L [id number]) Colonoscope was                             introduced through the anus and advanced to the the                            cecum, identified by appendiceal orifice and                            ileocecal valve. The colonoscopy was performed                            without difficulty. The patient tolerated the                            procedure well. The quality of the bowel                            preparation was good after lavage (scattered                            fibrous food debris remained in some areas). The                            ileocecal valve, appendiceal orifice, and rectum                            were photographed. The bowel preparation used was                            SUPREP via split dose instruction. Scope In: 10:37:11 AM Scope Out: 10:54:05 AM Scope Withdrawal Time: 0 hours 11 minutes 15 seconds  Total Procedure Duration: 0 hours 16 minutes 54 seconds  Findings:                 The perianal and digital rectal examinations were                            normal.  Repeat examination of right colon under NBI                            performed.                           Two sessile polyps were found in the rectum and                            ascending colon. The polyps were 4 to 6 mm in size.                            These polyps were removed with a cold snare.                            Resection and retrieval were complete.                           Diverticula were found in the left colon.                           The exam was otherwise without abnormality on                            direct and retroflexion views. Complications:            No immediate complications. Estimated Blood Loss:     Estimated blood loss was minimal. Impression:               - Two 4 to 6 mm polyps in the rectum and in the                            ascending colon, removed with a cold snare.                            Resected and retrieved.                            - Diverticulosis in the left colon.                           - The examination was otherwise normal on direct                            and retroflexion views.                           - The GI Genius (intelligent endoscopy module),                            computer-aided polyp detection system powered by AI                            was utilized to detect colorectal polyps through  enhanced visualization during colonoscopy. Recommendation:           - Patient has a contact number available for                            emergencies. The signs and symptoms of potential                            delayed complications were discussed with the                            patient. Return to normal activities tomorrow.                            Written discharge instructions were provided to the                            patient.                           - Resume previous diet.                           - Continue present medications.                           - Await pathology results.                           - Repeat colonoscopy is recommended for                            surveillance. The colonoscopy date will be                            determined after pathology results from today's                            exam become available for review. (Shorter side of                            surveillance interval given prep noted above)                            -close attention to preprocedure dietary                            restrictions regarding fibrous foods and consume                            more water with prep for next exam. Mallie Mussel L. Loletha Carrow, MD 07/23/2022 10:59:29 AM This report has been signed electronically.

## 2022-07-26 ENCOUNTER — Telehealth: Payer: Self-pay | Admitting: *Deleted

## 2022-07-26 NOTE — Telephone Encounter (Signed)
  Follow up Call-     07/23/2022   10:11 AM  Call back number  Post procedure Call Back phone  # [phone]  Permission to leave phone message Yes     Patient questions:  Do you have a fever, pain , or abdominal swelling? No. Pain Score  0 *  Have you tolerated food without any problems? Yes.    Have you been able to return to your normal activities? Yes.    Do you have any questions about your discharge instructions: Diet   No. Medications  No. Follow up visit  No.  Do you have questions or concerns about your Care? No.  Actions: * If pain score is 4 or above: No action needed, pain <4.

## 2022-07-29 ENCOUNTER — Encounter: Payer: Self-pay | Admitting: Gastroenterology

## 2023-07-19 ENCOUNTER — Encounter: Payer: 59 | Admitting: Family Medicine

## 2023-08-02 ENCOUNTER — Encounter: Admitting: Family Medicine

## 2023-08-10 ENCOUNTER — Encounter: Admitting: Family Medicine

## 2023-08-31 ENCOUNTER — Ambulatory Visit (INDEPENDENT_AMBULATORY_CARE_PROVIDER_SITE_OTHER): Admitting: Family Medicine

## 2023-08-31 ENCOUNTER — Encounter: Payer: Self-pay | Admitting: Family Medicine

## 2023-08-31 VITALS — HR 79 | Temp 98.6°F | Ht 69.0 in | Wt 171.0 lb

## 2023-08-31 DIAGNOSIS — Z13220 Encounter for screening for lipoid disorders: Secondary | ICD-10-CM | POA: Diagnosis not present

## 2023-08-31 DIAGNOSIS — Z0000 Encounter for general adult medical examination without abnormal findings: Secondary | ICD-10-CM

## 2023-08-31 DIAGNOSIS — Z23 Encounter for immunization: Secondary | ICD-10-CM

## 2023-08-31 DIAGNOSIS — R0981 Nasal congestion: Secondary | ICD-10-CM | POA: Insufficient documentation

## 2023-08-31 DIAGNOSIS — N400 Enlarged prostate without lower urinary tract symptoms: Secondary | ICD-10-CM | POA: Diagnosis not present

## 2023-08-31 NOTE — Assessment & Plan Note (Signed)
 Acute, pt feels possible cows milk allergy.. will eval with allergen panel.

## 2023-08-31 NOTE — Addendum Note (Signed)
 Addended by: Rona Cobia on: 08/31/2023 10:00 AM   Modules accepted: Orders

## 2023-08-31 NOTE — Assessment & Plan Note (Signed)
Followed by [facility]. Good control on flomax.

## 2023-08-31 NOTE — Progress Notes (Signed)
 Blood pressure 90/60, pulse 79, temperature 97.7 F (36.5 C), temperature source Temporal, height 5' 8.5" (1.74 m), weight 161 lb (73 kg), SpO2 96 %.  Chief Complaint  Patient presents with   Annual Exam    Last CPE 07/15/22   Medical Management of Chronic Issues    Has noticed increased allergy symptoms with food would like testing done.     History of Present Illness: HPI   The patient presents for annual medicare wellness, complete physical and review of chronic health problems. He/She also has the following acute concerns today: He has been noting  nasal congestion  associated with grilled cheese, cheese burger ... noting runny nose Noting in last 1-2 months.  No rash, no constipation, diarrhea.  No abd pain.  The patient is here for annual wellness exam and preventative care.    12/11/2018 Admitted for partial small bowel obstruction  Body mass index is 25.25 kg/m.  BP Readings from Last 3 Encounters:  07/23/22 111/63  07/15/22 90/60  01/12/19 100/62     Wt Readings from Last 3 Encounters:  08/31/23 171 lb (77.6 kg)  07/23/22 160 lb (72.6 kg)  07/15/22 161 lb (73 kg)   Diet: healthy Exercise: walking 8 miles daily, mainly at work   BPH: well controlled on flomax  Followed by Urology  Dr. Rosalba Collier.Aaron Aas PSA stable.  Allergic rhinitis: Stable on claritin or Xyzal .   Flowsheet Row Office Visit from 08/31/2023 in [facility]  PHQ-2 Total Score 0        COVID 19 screen No recent travel or known exposure to COVID19 The patient denies respiratory symptoms of COVID 19 at this time.  The importance of social distancing was discussed today.   Review of Systems  Constitutional:  Negative for chills and fever.  HENT:  Negative for congestion and ear pain.   Eyes:  Negative for pain and redness.  Respiratory:  Negative for cough and shortness of breath.   Cardiovascular:  Negative for chest pain, palpitations and leg swelling.   Gastrointestinal:  Negative for abdominal pain, blood in stool, constipation, diarrhea, nausea and vomiting.  Genitourinary:  Negative for dysuria.  Musculoskeletal:  Negative for falls and myalgias.  Skin:  Negative for rash.  Neurological:  Negative for dizziness.  Psychiatric/Behavioral:  Negative for depression. The patient is not nervous/anxious.       Past Medical History:  Diagnosis Date   Alcoholism in family    Allergic rhinitis, cause unspecified    Unspecified hyperplasia of prostate without urinary obstruction and other lower urinary tract symptoms (LUTS)     reports that he quit smoking about 22 years ago. His smoking use included cigarettes. He started smoking about 42 years ago. He has never used smokeless tobacco. He reports current alcohol use. He reports that he does not use drugs.   Current Outpatient Medications:    finasteride (PROSCAR) 5 MG tablet, Take 5 mg by mouth daily., Disp: , Rfl:    loratadine (CLARITIN) 10 MG tablet, Take 10 mg by mouth daily., Disp: , Rfl:    tamsulosin (FLOMAX) 0.4 MG CAPS capsule, Take 0.8 mg by mouth every evening., Disp: , Rfl:    Observations/Objective: Pulse 66, temperature 98.7 F (37.1 C), temperature source Temporal, height 5' 8.5" (1.74 m), weight 173 lb 8 oz (78.7 kg), SpO2 97 %. BP Readings from Last 3 Encounters:  07/23/22 111/63  07/15/22 90/60  01/12/19 100/62    Physical Exam Constitutional:  General: He is not in acute distress.    Appearance: Normal appearance. He is well-developed. He is not ill-appearing or toxic-appearing.  HENT:     Head: Normocephalic and atraumatic.     Right Ear: Hearing, tympanic membrane, ear canal and external ear normal.     Left Ear: Hearing, tympanic membrane, ear canal and external ear normal.     Nose: Nose normal.     Mouth/Throat:     Pharynx: Uvula midline.  Eyes:     General: Lids are normal. Lids are everted, no foreign bodies appreciated.     Conjunctiva/sclera:  Conjunctivae normal.     Pupils: Pupils are equal, round, and reactive to light.  Neck:     Thyroid: No thyroid mass or thyromegaly.     Vascular: No carotid bruit.     Trachea: Trachea and phonation normal.  Cardiovascular:     Rate and Rhythm: Normal rate and regular rhythm.     Pulses: Normal pulses.     Heart sounds: S1 normal and S2 normal. No murmur heard.    No gallop.  Pulmonary:     Breath sounds: Normal breath sounds. No wheezing, rhonchi or rales.  Abdominal:     General: Bowel sounds are normal.     Palpations: Abdomen is soft.     Tenderness: There is no abdominal tenderness. There is no guarding or rebound.     Hernia: No hernia is present.  Musculoskeletal:     Cervical back: Normal range of motion and neck supple.  Lymphadenopathy:     Cervical: No cervical adenopathy.  Skin:    General: Skin is warm and dry.     Findings: No rash.  Neurological:     Mental Status: He is alert.     Cranial Nerves: No cranial nerve deficit.     Sensory: No sensory deficit.     Gait: Gait normal.     Deep Tendon Reflexes: Reflexes are normal and symmetric.  Psychiatric:        Speech: Speech normal.        Behavior: Behavior normal.        Judgment: Judgment normal.      Assessment and Plan The patient's preventative maintenance and recommended screening tests for an annual wellness exam were reviewed in full today. Brought up to date unless services declined.  Counselled on the importance of diet, exercise, and its role in overall health and mortality. The patient's FH and SH was reviewed, including their home life, tobacco status, and drug and alcohol status.   Vaccines:  GivenTdap, uptodate with flu, consider Shingrix. DUE for Prevnar 20  PSA per [facility] Dr. Rosalba Collier Lab Results  Component Value Date   PSA 3.69 05/16/2015   PSA 2.63 05/30/2009  Colonoscopy: 2024 polyps  Dr. Arvie Latus, rec repeat in 3 years.  Hep C and HIV: neg in past  Former smoker 20 pack year history  quit >15 years ago, e: cigs daily with nictotine  ETOH: once yearly  No drug use.  Routine general medical examination at a health care facility  Benign prostatic hyperplasia without lower urinary tract symptoms Assessment & Plan: Followed by [facility]. Good control on flomax.   Nasal congestion Assessment & Plan:  Acute, pt feels possible cows milk allergy.. will eval with allergen panel.  Orders: -     Food Allergy Profile; Future  Screening cholesterol level -     Comprehensive metabolic panel with GFR; Future -     Lipid  panel; Future        Herby Lolling, MD
